# Patient Record
Sex: Female | Born: 1985 | Hispanic: Yes | Marital: Single
Health system: Western US, Academic
[De-identification: ages and names within clinical notes are randomized; demographics above are authoritative.]

---
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
## 2004-01-03 ENCOUNTER — Ambulatory Visit (INDEPENDENT_AMBULATORY_CARE_PROVIDER_SITE_OTHER): Admitting: Gynecology

## 2004-01-07 NOTE — Op Note (Signed)
12/29/03  Dictating Practitioner: Burnett Kanaris, M.D.  Staff Physician:  Date of Operation: 12/29/2003  PREOPERATIVE DIAGNOSES: (1) Intrauterine pregnancy at 25-4/[redacted] weeks  gestation. (2) Fetal anemia with limited middle cerebral artery peak  systolics.  POSTOPERATIVE DIAGNOSES: (1) Intrauterine pregnancy at 25-4/[redacted] weeks  gestation. (2) Fetal anemia with limited middle cerebral artery peak  systolics, status post intrauterine fetal transfusion.  OPERATION: Percutaneous subumbilical blood sampling and fetal  transfusion.  SURGEON/STAFF: ASST: M. Tarsa  ANESTHESIA: Conscious sedation  ESTIMATED BLOOD LOSS: Minimal  COMPLICATIONS: None  INDICATIONS: This patient is a 18 year old gravida 3 para 1 at 25-4/[redacted]  weeks gestation with progressively worsening cerebral artery Dopplers. She  has been consented for intrauterine fetal transfusion and potentially  emergent cesarean section in case of fetal intolerance to the procedure.  PROCEDURE: The patient was taken to the operating room and placed in the  dorsal supine position and was prepped and draped in the normal sterile  fashion. Under ultrasound guidance, insertion of a 20-gauge spinal needle  was performed. The placenta was not traversed. A loop of cord near the  abdominal wall was entered. Pretransfusion hemoglobin was 8 grams with a  hematocrit of 24 percent. The fetus was transfused 47 ml to  post-transfusion hemoglobin of 18 with hematocrit of 48 percent. Fetal  heart tones were observed and noted to be 140s.  Pretransfusion, 0.1 mg of pancuronium was injected for paralyzing the  fetus.  POSTOPERATIVE PLAN: The plan is to watch the patient on Labor and  Delivery until about 10 p.m. If fetal heart status is reassuring, to  discharge home with followup in two days for fetal heart tones and  Dopplers.  Signature Derived From Controlled Access Password  Burnett Kanaris, M.D. 01/03/2004 15:06  DD: 12/29/2003 DT: 12/29/2003 6:36 P DocNo.: [id number]  [id number].[facility]  Referring  Physician:  NONE  Primary Care Physician:  [facility]  [address]  [place], [place] [address]  cc:

## 2004-01-10 ENCOUNTER — Ambulatory Visit (INDEPENDENT_AMBULATORY_CARE_PROVIDER_SITE_OTHER): Admitting: Gynecology

## 2004-01-12 NOTE — Op Note (Addendum)
Dictating Practitioner: Burnett Kanaris, M.D.    Staff Physician:    Date of Operation: 12/29/2003              PREOPERATIVE DIAGNOSES: (1) Intrauterine pregnancy at 25-4/[redacted] weeks  gestation. (2) Fetal anemia with limited middle cerebral artery peak  systolics.  POSTOPERATIVE DIAGNOSES: (1) Intrauterine pregnancy at 25-4/[redacted] weeks  gestation. (2) Fetal anemia with limited middle cerebral artery peak  systolics, status post intrauterine fetal transfusion.  OPERATION: Percutaneous subumbilical blood sampling and fetal  transfusion.  SURGEON/STAFF: ASST: M. Deleah Tison    ANESTHESIA: Conscious sedation  ESTIMATED BLOOD LOSS: Minimal    COMPLICATIONS: None    INDICATIONS: This patient is a 18 year old gravida 3 para 1 at 25-4/[redacted]  weeks gestation with progressively worsening cerebral artery Dopplers. She  has been consented for intrauterine fetal transfusion and potentially  emergent cesarean section in case of fetal intolerance to the procedure.    PROCEDURE: The patient was taken to the operating room and placed in the  dorsal supine position and was prepped and draped in the normal sterile  fashion. Under ultrasound guidance, insertion of a 20-gauge spinal needle  was performed. The placenta was not traversed. A loop of cord near the  abdominal wall was entered. Pretransfusion hemoglobin was 8 grams with a  hematocrit of 24 percent. The fetus was transfused 47 ml to  post-transfusion hemoglobin of 18 with hematocrit of 48 percent. Fetal  heart tones were observed and noted to be 140s.    Pretransfusion, 0.1 mg of pancuronium was injected for paralyzing the  fetus.    POSTOPERATIVE PLAN: The plan is to watch the patient on Labor and  Delivery until about 10 p.m. If fetal heart status is reassuring, to  discharge home with followup in two days for fetal heart tones and  Dopplers.                  Signature Derived From Controlled Access Password  Burnett Kanaris, M.D. 01/03/2004 15:06        DD: 12/29/2003 DT: 12/29/2003 6:36 P  DocNo.: [id number]  [id number]/[id number].[facility]    Referring Physician:  NONE        Primary Care Physician:  [facility]  [address]  [place], [place] [address]    cc:

## 2004-01-17 ENCOUNTER — Other Ambulatory Visit (INDEPENDENT_AMBULATORY_CARE_PROVIDER_SITE_OTHER): Payer: Self-pay | Admitting: Gynecology

## 2004-01-24 ENCOUNTER — Other Ambulatory Visit (INDEPENDENT_AMBULATORY_CARE_PROVIDER_SITE_OTHER): Payer: Self-pay | Admitting: Gynecology

## 2004-01-25 ENCOUNTER — Other Ambulatory Visit: Payer: Self-pay

## 2004-01-26 ENCOUNTER — Inpatient Hospital Stay (HOSPITAL_COMMUNITY)

## 2004-01-26 LAB — ADIF: Plt Est: NORMAL

## 2004-01-26 LAB — CBC WITH DIFF, BLOOD
Basophils: 1 % (ref 0–2)
Eosinophils: 4 % — ABNORMAL HIGH (ref 1–3)
Hct: 29.7 % — ABNORMAL LOW (ref 36.0–46.0)
Hgb: 9.8 g/dL — ABNORMAL LOW (ref 12.0–16.0)
Lymphocytes: 18 % — ABNORMAL LOW (ref 20–40)
MCH: 25.2 pg — ABNORMAL LOW (ref 27–31)
MCHC: 32.9 % (ref 32–37)
MCV: 76.8 um3 — ABNORMAL LOW (ref 82.0–98.0)
MPV: 7.9 fL (ref 7.4–10.4)
Monocytes: 6 % (ref 1–10)
Plt Count: 271 10*3/uL (ref 130–400)
RBC: 3.86 10*6/uL — ABNORMAL LOW (ref 4.00–5.00)
RDW: 13.1 % (ref 10–15)
Segs: 72 % — ABNORMAL HIGH (ref 45–70)
WBC: 9.9 10*3/uL (ref 4.0–11.0)

## 2004-01-28 ENCOUNTER — Encounter: Payer: Self-pay | Admitting: Obstetrics & Gynecology

## 2004-01-31 ENCOUNTER — Ambulatory Visit (INDEPENDENT_AMBULATORY_CARE_PROVIDER_SITE_OTHER): Admitting: Gynecology

## 2004-02-08 ENCOUNTER — Ambulatory Visit (INDEPENDENT_AMBULATORY_CARE_PROVIDER_SITE_OTHER): Admitting: Obstetrics & Gynecology

## 2004-02-10 ENCOUNTER — Other Ambulatory Visit (INDEPENDENT_AMBULATORY_CARE_PROVIDER_SITE_OTHER): Payer: Self-pay | Admitting: Gynecology

## 2004-02-14 ENCOUNTER — Encounter (INDEPENDENT_AMBULATORY_CARE_PROVIDER_SITE_OTHER): Admitting: Gynecology

## 2004-02-18 ENCOUNTER — Inpatient Hospital Stay (HOSPITAL_COMMUNITY)

## 2004-02-18 ENCOUNTER — Other Ambulatory Visit (HOSPITAL_BASED_OUTPATIENT_CLINIC_OR_DEPARTMENT_OTHER): Payer: Self-pay | Admitting: Gynecology

## 2004-02-18 LAB — HEMOGRAM, BLOOD
Hct: 28.9 % — ABNORMAL LOW (ref 36.0–46.0)
Hgb: 9.7 gm/dL — ABNORMAL LOW (ref 12.0–16.0)
MCH: 25.1 pg — ABNORMAL LOW (ref 27–31)
MCHC: 33.7 % (ref 32–37)
MCV: 74.5 um3 — ABNORMAL LOW (ref 82.0–98.0)
MPV: 7.8 fL (ref 7.4–10.4)
Plt Count: 298 10*3/uL (ref 130–400)
RBC: 3.87 10*6/uL — ABNORMAL LOW (ref 4.00–5.00)
RDW: 13.9 % (ref 10–15)
WBC: 3.9 10*3/uL — ABNORMAL LOW (ref 4.0–11.0)

## 2004-02-19 ENCOUNTER — Other Ambulatory Visit (HOSPITAL_BASED_OUTPATIENT_CLINIC_OR_DEPARTMENT_OTHER): Payer: Self-pay | Admitting: Gynecology

## 2004-02-19 LAB — BILIRUBIN, TOTAL BLOOD: Bilirubin, Tot: 2.7 mg/dL — ABNORMAL HIGH (ref <1.2)

## 2004-02-19 LAB — RAPID PLASMA REAGIN, BLOOD: RPR: NONREACTIVE

## 2004-02-20 LAB — HEMOGRAM, BLOOD
Hct: 26.8 % — ABNORMAL LOW (ref 36.0–46.0)
Hgb: 9 g/dL — ABNORMAL LOW (ref 12.0–16.0)
MCH: 25.2 pg — ABNORMAL LOW (ref 27–31)
MCHC: 33.6 % (ref 32–37)
MCV: 75.1 um3 — ABNORMAL LOW (ref 82.0–98.0)
MPV: 7.9 fL (ref 7.4–10.4)
Plt Count: 283 10*3/uL (ref 130–400)
RBC: 3.56 10*6/uL — ABNORMAL LOW (ref 4.00–5.00)
RDW: 13.9 % (ref 10–15)
WBC: 2.7 10*3/uL — ABNORMAL LOW (ref 4.0–11.0)

## 2004-02-22 LAB — HIV 1/2 ANTIBODY & P24 ANTIGEN ASSAY, BLOOD: HIV 1/2 Antibody & P24 Antigen Assay: NONREACTIVE

## 2004-02-22 LAB — URINE CULTURE

## 2004-02-25 LAB — BLOOD CULTURE

## 2004-02-25 NOTE — Op Note (Signed)
Dictating Practitioner: Jairo Ben. Gardner Candle, M.D.    Staff Physician: Burnett Kanaris, M.D.    Date of Operation: 02/19/2004          PREOPERATIVE DIAGNOSES: (1) Intrauterine pregnancy at 33 weeks. (2) Rh  isoimmunization with fetal anemia. (3) Active phase arrest.  POSTOPERATIVE DIAGNOSES: (1) Intrauterine pregnancy at 33 weeks. (2) Rh  isoimmunization with fetal anemia. (3) Active phase arrest.  PROCEDURE: Primary low transverse cesarean section via Pfannenstiel.  SURGEON/STAFF: Bonnetta Barry ASST: GCaryl Asp. Barcarse    INTRAVENOUS FLUIDS: 1500 ml.  URINE OUTPUT: 200 ml of clear urine at the end of the procedure.  ESTIMATED BLOOD LOSS: 1000 ml.  ANESTHESIA: Spinal.    FINDINGS: Female infant in the vertex position with Apgars of 8 and 9 at 1  and 5 minutes, respectively, normal uterus, tubes and ovaries, weight of  2305 g.    COMPLICATIONS: None.    PROCEDURE: The patient was taken to the operating room where her spinal  anesthesia was found to be adequate. She was then prepared and draped in  the usual sterile fashion in the dorsal supine position with a leftward  tilt. She was tested with Allis clamps and noted to be pain free.    A Pfannenstiel skin incision was made with a scalpel and carried to the  underlying layer of fascia with the Bovie. The fascia was incised in the  midline and this incision was extended laterally with a Mayo scissors. The  inferior aspect of the fascial incision was grasped with Kocher clamps,  elevated and the underlying rectus muscles dissected off bluntly and with  the Mayo scissors.    In a similar fashion, the superior aspect of the fascial incision was  grasped with Kocher clamps, elevated and the underlying rectus muscles  dissected off bluntly. The rectus muscles were separated in the midline.  The peritoneum was identified and entered bluntly with a digit. The  peritoneal incision was extended superiorly and inferiorly with good  visualization of the bladder. The  bladder blade was then inserted and the  uterine position was assessed. The vesicouterine peritoneum was  identified, grasped with pickups and entered sharply with the Metzenbaum  scissors. This incision was extended laterally and the bladder flap was  created digitally. The lower uterine segment was incised carefully with a  scalpel and extended laterally with digital stretching.    The infant's head and body were delivered atraumatically. The infant was  bulb suctioned. The cord was clamped and cut and the infant was handed off  to the waiting pediatricians. Cord gases were sent. Arterial blood gas:  pH 7.31, PCO2 50, PO2 26, base excess -2, bicarbonate 25. Venous gas: pH  7.30, PCO2 54, PO2 22, base excess -1 and bicarbonate 26. The placenta was  then removed with gentle expression and the uterus was exteriorized and  cleared of all clots and debris.    A 1-0 chromic was used in a running locked fashion to obtain excellent  hemostasis. There was an area of bleeding noted in the left corner felt to  be a branch of the uterine artery which was ligated in a figure-of-eight  fashion to obtain excellent hemostasis. A second layer of the same suture  was then used in an imbricating fashion and good hemostasis was noted. The  bladder flap was then closed with 4-0 chromic. There was a rent noted  slightly lower than the bladder flap but not in the bladder as the Foley  bulb  was brought up and could not be brought to that rent which was  repaired with 4-0 chromic as well. The uterus was returned to the abdomen.  The gutters were cleared of all clots and debris and the uterine incision  was carefully reinspected and noted to have excellent hemostasis. The  fascial planes and muscle layers were also inspected and noted to be  hemostatic. The fascia was reapproximated with an 0 Dexon in a running  fashion. The subcutaneous area was irrigated with warm normal saline and  the skin was closed with staples.    The patient  tolerated the procedure well. Sponge, lap and needle counts  were correct times two. Ancef 1 g was given at the time of cord clamp.  The patient was taken to the recovery room in stable condition. The  attending physician, Dr. Herbie Saxon, was present and scrubbed for the entire  procedure.                      Signature Derived From Controlled Access Password  Burnett Kanaris, M.D. 02/25/2004 09:08    DD: 02/21/2004 DT: 02/21/2004 4:33 P DocNo.: [id number]  GCM/[id number].[facility]    Referring Physician:  SELF        Primary Care Physician:  UNKNOWN        cc:

## 2004-02-25 NOTE — Lab (Signed)
FINAL PATHOLOGIC DIAGNOSIS:  A: Uterus, placenta, cesarean section   -Immature placenta.   -Necrotic umbilical artery.   -Mural thrombosis of fetal surface vessels.  COMMENT: In the umbilical cord, there is one necrotic artery with necrosis  extending into the Wharton's jelly. Anucleated red-blood cells are present  in the vessels of the umbilical cord (consistent with history of fetal  transfusions). Hemosiderin-laden macrophages are present in the membranes.  There is no inflammation identified. This case was reviewed by Dr. Levin Bacon.  SPECIMEN(S) SUBMITTED: A: Placenta  CLINICAL HISTORY: 18 year old mother, G3P3 status post first LTCS for  APA/failed induction @33 .3 weeks for Rh isoimmunization, status post PUBS x  4 with increased MCA dopplers 1.5 MOM. Fetal transfusions x 4 (lost one  umbilical artery) Rh incompatibility. Prematurity. Baby's medical record  number [phone] (Boy), birth weight 2305 grams, apgars 8/9.  GROSS DESCRIPTION: A: Received without fixative labeled with the  patient's name, medical record number and "placenta" is a discoid placenta  measuring 19 x 17 x 1.5 cm. The umbilical cord measures 31 cm in length  and 1 cm in diameter with a slight left twist. The umbilical cord is  eccentrically inserted with three vessels on cross section. There is green  discoloration and probable thrombus but there is no knot. The membranes  are complete and ruptured at the placental margin. The fetal surface is  purple and translucent. The maternal surface is intact. There is no  calcification and no retroplacental hematoma. The trimmed weight is 330  grams. Serial sectioning of the placenta reveals red-brown spongy villous  tissue. There are no infarcts and no intervillous thrombi. Representative  sections are submitted in cassettes A1 through A4.  [facility]  CONFIDENTIAL HEALTH INFORMATION: Health Care information is personal and  sensitive information. If it is being faxed to you it is done so  under  appropriate authorization from the patient or under circumstances that do  not require patient authorization. You, the recipient, are obligated to  maintain it in a safe, secure and confidential manner. Re-disclosure  without additional patient consent or as permitted by law is prohibited.  Unauthorized re-disclosure or failure to maintain confidentiality could  subject you to penalties described in federal and state law. If you have  received this report or facsimile in error, please notify the [facility] immediately and destroy the received document(s).   Material reviewed and Interpreted and  Report Electronically Signed by:  Kingsley Spittle M.D. [phone])  Attending Surgical Pathologist  02/25/04 16:49  Electronic Signature derived from a single  controlled access password

## 2004-02-29 ENCOUNTER — Emergency Department: Payer: Self-pay

## 2004-02-29 ENCOUNTER — Other Ambulatory Visit: Payer: Self-pay | Admitting: Emergency Medicine

## 2004-03-01 ENCOUNTER — Emergency Department (HOSPITAL_BASED_OUTPATIENT_CLINIC_OR_DEPARTMENT_OTHER): Admitting: Emergency Medicine

## 2004-03-01 ENCOUNTER — Other Ambulatory Visit: Payer: Self-pay | Admitting: Pulmonary Disease

## 2004-03-01 LAB — CHLAMYDIA/GC PCR-GENITAL: Chlamydia/GC PCR-Genital: NEGATIVE

## 2004-03-02 ENCOUNTER — Ambulatory Visit (INDEPENDENT_AMBULATORY_CARE_PROVIDER_SITE_OTHER): Admitting: Maternal & Fetal Medicine

## 2004-03-02 NOTE — ED Notes (Addendum)
============================== ADMIT SUMMARY ==============================    RECEIVING NURSE -      ED NURSE -     +------------------------------- ALLERGIES -------------------------------+   none    +-------------------------- ADMITTING DIAGNOSIS --------------------------+   UTI   recent vaginal delivery complicated by endometritis    +--------------------------- ADMITTING SERVICE ---------------------------+    +------------------------ MOST RECENT VITAL SIGNS ------------------------+  BP - 113/65 PULSE - 63   RESPIRATIONS - 18 O2 SAT - 99   TEMPERATURE - 99.3 MODE - Oral   GCS TOTAL -   PAIN - 0 PAIN QUALITY - N/A   PAIN LOCATION - none     +-------------------------------- FLUIDS ---------------------------------+  DATE TIME IV FLUID L/R LOCATION SIZE HUNG ABSORBED  ---------------------------------------------------------------------------     TOTAL IV: 0 ml    TOTAL OUTPUT: 0 ml TOTAL PO: 0 ml    +------------------------------ MEDICATIONS ------------------------------+  DATE TIME MEDICATION VERIFYING RN RN INIT  ---------------------------------------------------------------------------    09/14 0340 SULFAMETHOXAZOLE /TRIMETHOPRIM 1 KEM   Tabs PO    +------------------------------- LABS DONE -------------------------------+  ACT- MD MD RN AP +INITIALS+  IVE DATE TIME TIME TIME TREATMENT ORDERS MD RN AP   ---------------------------------------------------------------------------     09/14 0246 0305 0308 CHLAMYDIA/GC PCR-URINE NXL KEM lau   Specimen Type: Urine   09/14 0246 0305 0308 CULTURE,GC, CHLAMYDIA/GC NXL KEM lau   PCR-GENITAL   Specimen Type: Genital    EKG DONE - NO    +---------------------------- PROCEDURE NOTES ----------------------------+    +------------------------ OTHER NURSING PROCEDURES -----------------------+     +--------------------------- PSYCHOSOCIAL NEEDS --------------------------+  +------------------------- BARRIERS TO LEARNING --------------------------+    ASSESSMENT-  Assessment Done with Findings of:      BARRIERS-    No Barriers  SUPPORT PERSON-      SPECIAL CONSIDERATIONS-    assessment done at 0215  ============================== TRIAGE RECORD ==============================    CHIEF COMPLAINT- Vaginal Bleed    TIME OF ONSET- N/A : +-STANDING-+ +--SEATED--+  TRIAGE CATEGORY- 2 : BP PULSE BP PULSE   ROOM- 10 : N/A/N/A N/A 113/65 63   MODE OF ARRIVAL- Car :   IN CUSTODY- No : TEMP MODE O2SAT RESP LMP   PRIVATE MD- quidos : 99.3 Oral 99 18 N/A       :   WORK RELATED INJURY- No : +--GCS--+ +--PUPILS--+  RETURN IN 72 HOURS- No : E V M TOT L R RESPONSE  TRIAGE NURSE- Efren Fuerte : X X X N/A X X N/A     IS THIS VISIT RELATED TO ASSAULT OR DOMESTIC VIOLENCE- no   PAIN TYPE- V NOW- 0 TOLERABLE AT- 0 QUALITY- N/A      PAIN LOCATION- none RADIATES TO- none   LATEX ALLERGY FORM- No LATEX ALLERGY- No TETANUS- N/A   IMMUNIZATION- N/A PED HEIGHT- N/A WEIGHT- N/A KG  ADDITIONAL FORMS- No   +------------------------- CARE PRIOR TO ARRIVAL -------------------------+   no  +------------------------------- ALLERGIES -------------------------------+   none  +------------------------------ MEDICATIONS ------------------------------+   vicoden, motrin  +-------------------------- PAST MEDICAL HISTORY -------------------------+   g3,p2, ab1  +---------------------------- CURRENT HISTORY ----------------------------+   states bleeding and passing clots when using restroom, used 3pads   yesterday,    =========================== REASSESSMENT VITALS ===========================   R T M I   E E O N   S M D O2 PUPILS +---GCS----+ I  DATE TIME BP HR P P E SAT L R E V M TOT POSITION T  ---------------------------------------------------------------------------    09/14 0001 113/65 63 18 99.3 O 99  Seated EAF  09/14 0001 / Standing EAF     +-----------------PAIN-----------------+   T       Y N T       P O O      DATE TIME E W L LOCATION QUALITY RADIATES COMMENT       ---------------------------------------------------------------------------    09/14 0001 V 0 0 none N/A none Triage  09/14 0001 Triage    ============================= NURSE DISCHARGE =============================    DISCHARGE NURSE- Arsenio Loader   DISPOSITION- Seen & Treated WITH- By Self   ACCOMPANIED BY- N/A   Jeryl Columbia Brantley Fling-    N/A   TIME OF DISPOSITION- 03/01/2004 0330 LEFT ED VIA- Ambulate   TEANSFERRED TO- N/A REASON- N/A   ADMITTED TO- N/A ROOM- N/A   NURSE REPORT TO- N/A REPORT TIME- N/A   BELONGINGS- With Patient ENVELOPE NUMBER- N/A   CONDITION ON DISCHARGE- Stable   AFTERCARE PROVIDED WITH- Written and Verbal   WHAT AFTERCARE INSTRUCTIONS WERE GIVEN AND REVIEWED WITH PATIENT  AND/OR FAMILY?- (see EPIC instructions)   epic   IN WHAT LANGUAGE WERE THESE GIVEN?- English OTHER: N/A   TRANSLATED BY- N/A OTHER: N/A   GCS- E: 4 V: 5 M: 6 TOTAL: 15   PAIN LEVEL UPON DISPOSITION- 0 OUT OF 10  EXPECTED OUTCOMES MET- Yes   WHAT MEDS WERE PROVIDED FROM DISCHARGE PYXIS?-    N/A   RX TO BE FILLED FOR- N/A   DID THE PATIENT OR RESPONSIBLE CARE PROVIDER UNDERSTAND THE FOLLOW UP  RECOMMENDATION?- No DISPOSITION BY- RN        ============================== POINT OF CARE ==============================    OCCULT BLOOD STOOL RESULTS   Norm results neg.  DATE TIME RESULTS DONE BY CONTROL POSTIVE CONTROL NEGATIVE     URINE PREGNANCY TEST   Norm results for non pregnant females neg.  DATE TIME RESULTS DONE BY     URINE DIP   Norm results - All neg. with pH 4.6 to 8.0 and urobili 0.1 to 1.0   LEUKO NI- PRO- GLU- URO-   DATE TIME CYTE TRITE PH TEIN COSE KETONES BILI BILI BLOOD BY     FINGER STICK GLUCOSE   Norm results 60 to 110 mg/dl  DATE TIME RESULTS DONE BY     FINGER STICK HEMOGLOBIN   Norm results adult female 27 to 17 gm/dl  Norm results adult female 30 to 16 gm/dl  DATE TIME RESULTS DONE BY   09/14 0142 11.3 FGG     ============================== MD NOTES H&P ===============================    TIME OF NOTE WRITTEN-  N/A      CHIEF COMPLAINT- N/A        HISTORY OF PRESENT ILLNESS  09/14 0427 Heywood Iles, MD     18 y.o. female G3P2A1 s/p recent C section 9/3, complicated   by per history endometritis, necessitating IV antibiotics.    Pt still had subjective fevers after d/c. Pt. continued to   have vaginal bleeding after d/c. Pt. states that bleeding   stopped 2 days ago, but bleeding restarted today, with   passage of blood clots. Used 3 pads today. Persistent   subjective fevers, now with dysuria. No flank pain.      PAST MEDICAL/SURGICAL HISTORY  09/14 0427 Heywood Iles, MD     s/p C section 9/3 complicated by endometritis   G3P2A1      FAMILY HISTORY- NC   SOCIAL HISTORY- no tob, no drugs, occ. ETOH   OTHER- NC  REVIEW OF SYSTEMS- All other systems reviewed and are negative.     PHYSICAL EXAM  09/14 0427 Ni-cheng Elaina Pattee, MD     Gen: thin young female, NAD   HEENT: PERRL, OP: PMMM   Neck: supple, no JVD   CV: S1, S2, no m/r/g   Lungs: CTAB   Back: no flank pain   Abd: tender to palpation at L and R inguinal, and suprapubic   areas, no rebound, no guarding , + BS   Pelvic exam: speculum- visible blood clots passing through   cervical os, no discharge on vaginal mucosa, bimanual exam:   mild L adnexal tenderness, no tenderness over fundus, no R   adnexal tenderness, no CMT   Extrem: no edema      IMPRESSION  09/14 0427 Heywood Iles, MD     18 y.o. G53P2A1 female s/p 9/3 C section delivery complicated   by endometritis, treated with IV abx, c/o recurrent vaginal   bleeding, dysuria concerning for UTI, retained products of   conception, PID, given findings on PE less likely recurrent   endometritis.      MEDICAL DECISION MAKING  09/14 0427 Ni-cheng Elaina Pattee, MD     -u/a- s/f UTI- first dose Bactrim here, rx given    -GC PCR, culture, urine   -d/c home with OB/GYN f/u

## 2004-03-02 NOTE — ED Notes (Addendum)
==============================   ATTENDING NOTE =============================    09/14 0315 [name], MD Attending     Pt seen and discussed iwth DR. Elaina Pattee.      18 yo femlae here for abdominal pain. States has been there   since yesterday. [place] passing blood vaginally. denies   fevers or chills. no pain with urination. no frequency. has   hx of endometritis, seen by gyn. tkaing motrin and vicodin.   no nausea or vomiting. no diarrhea.      PE:   Patient alert, awake, appropriate. Moves about the bed   without diffiuclty.   Head: normocephalic, atraumatic. Perrl, eomi. No scleral   icterus or conjunctival injection. Oropharynx without   erythema or exudates.   Neck: supple, no lymphadenopathy.   Chest: clear to auscultation bilaterally. No wheezes,   rhonchi, rales.   Heart: s1s2 regular rate.   Abdomen: positive active bowel sounds in all 4 quadrants.   Soft, mildly tender in both lower quadrants and   suprapubically. nondistended. No rebound or guarding.   pelvic per residnet note.   Extremities: no cyanosis, clubbing, edema.   Neuro: CN 2-12 intact. Strength 5/5 globally. Sensation   intact. No ataxia with gait.   Skin: warm, dry, no rashes.      although pt may have retained products, is not fevrile or   passing sig blood. can f/u wioth gyn as outpt. mild   tenderness, but not consistet with ongoing/increasing   endometritis. no evidence of pid. ua iwth evidence of   infection. will rx abx, first dose here. to return if sx   worsen. otherwise f/u with ob this week.   Agreew ith resident assessemtn and plan.

## 2004-03-02 NOTE — ED Notes (Addendum)
CASE PRESENTED TO- Kelsey Weber     ============================= PHYSICIAN NOTES =============================      ============================= PROCEDURE NOTES =============================      ================================ LAB NOTES ================================    09/14 0430 Kelsey Elaina Pattee, MD     POC Hgb wnl, u/a s/f UTI.      ================================= IMAGES ==================================      ============================== MD DISCHARGE ===============================    DISCHARGE PHYSICIAN- Kelsey Weber   CHIEF COMPLAINT- N/A CASE PRESENTED TO- Kelsey Weber   CONDITION OF DISCHARGE- Stable   WAS THIS VISIT FOR A WORK RELATED ILLNESS OR INJURY- No      PRIMARY CARE PHYSICIAN- Kelsey Weber   HAS PCP BEEN CONTACTED- No H&P NOTE WAS DICTATED- No   DISCHARGE DIAGNOSIS-    UTI recent vaginal delivery complicated by endometritis     DISCHARGE INSTRUCTIONS    09/14 0339 Kelsey Iles, MD     PHYSICIAN- [name], MD Attending FOLLOW-UP (DAYS)- OB/GYN    APPOINTMENT- No RETURN TO- N/A LANGUAGE- English    INSTRUCTIONS-   ABNORMAL VAGINAL BLEEDING    URINARY TRACT INFECTION    MEDICATIONS-   SULFA DRUGS    REFERRAL CLINICS-   OB/GYN [place]    REFERRAL PHYSICIANS-   ADDITIONAL INSTRUCTIONS-   Follow- up with OB/GYN to make sure you do not have any   retained   products of conception. Return to emergency room if your   abdominal   pain worsens, if bleeding worsens, if you start having   abnormal   vaginal discharge, or if fevers and chills persist.      ============================= FOLLOW UP NOTES =============================    09/15 03/02/2004 2233 Valla Leaver, MD     Reason for Addendum or Follow Up: Microbiology Culture   Result Follow Up   Action Taken: None required   pt was given rx for cipro on 03/01/04, urine cx grew e. coli   > 100,000 cfu => sens to TMP/SMX

## 2004-03-02 NOTE — ED Notes (Addendum)
=================================   ORDERS ==================================    ACT- MD MD RN AP +INITIALS+  IVE DATE TIME TIME TIME TREATMENT ORDERS MD RN AP   ---------------------------------------------------------------------------     09/14 0130 0311 0131 POC UPT LGL KEM MQR   09/14 0130 0311 0130 POC FSHG LGL KEM MQR   09/14 0246 0305 0308 CHLAMYDIA/GC PCR-URINE NXL KEM lau   Specimen Type: Urine   09/14 0246 0305 0308 CULTURE,GC, CHLAMYDIA/GC NXL KEM lau   PCR-GENITAL   Specimen Type: Genital   09/14 0304 0305 0309 Discharge NXL KEM lau   09/14 0310 0314 0315 SULFAMETHOXAZOLE /TRIMETHOPRIM 1 NXL KEM lau   Tabs PO

## 2004-03-03 ENCOUNTER — Emergency Department (HOSPITAL_BASED_OUTPATIENT_CLINIC_OR_DEPARTMENT_OTHER): Admitting: Emergency Medicine

## 2004-03-03 LAB — GONOCOCCUS CULTURE

## 2004-03-09 NOTE — Op Note (Addendum)
Dictating Practitioner: Jori Moll. Arlean Hopping, M.D.    Staff Physician: Jori Moll. Arlean Hopping, M.D.    Date of Operation: 12/01/2003          PREOPERATIVE DIAGNOSES: (1) Intrauterine pregnancy at 22 weeks 4 days.  (2) Rh isoimmunization. (3) Fetal hydrops.  POSTOPERATIVE DIAGNOSES: (1) Intrauterine pregnancy at 22 weeks 4 days.  (2) Rh isoimmunization. (3) Fetal hydrops.  OPERATION: Periumbilical blood sampling and intrauterine blood  transfusion.  SURGEON/STAFF: A. Hull/D. Schrimmer/V. Antoino Westhoff    ANESTHESIA: IV sedation.  COMPLICATIONS: None.  ESTIMATED BLOOD LOSS: Less than 20 mL.    INDICATIONS: This is a 18 year old, gravida 3, para 1, today at 22 weeks 4  days of gestation, with Rh isoimmunization. Her ultrasound examinations  had revealed an increased systolic velocity in the middle cerebral artery  suggesting fetal anemia. Also, mild ascites and pericardial effusions have  been noted. The risks versus the benefits of the procedure were discussed  with the patient and informed consent was obtained.    FINDINGS: Single, viable, live fetus. On today's evaluation, no signs of  ascites or pericardial effusions were noted. Middle cerebral artery  Dopplers were performed prior to the procedure, with a peak systolic  velocity that was less than 1.29 MoM.    PROCEDURE: After obtaining signed informed consent, the patient was taken  to the operating room. She was prepped as usual. Conscious sedation was  given. Guided with ultrasound, four attempts were performed with a  20-gauge needle through the fetal abdominal cord insertion. Blood 0.5 mL  was obtained and sent for hematocrit, and 0.1 mL of pancuronium were  administered to the fetus. Blood 16 mL with a hematocrit of 71% was then  transfused. Postoperative hematocrit was then obtained. The  pretransfusion hemoglobin was 14.7 and the postoperative hemoglobin was  17.4. MCA Dopplers following the procedure showed a peak systolic velocity  of 35. Fetal heart rate was documented  at 135 beats per minute following  the procedure.    The patient tolerated the procedure well and was taken to the recovery room  in stable condition.                      Manual Signature  Manual Signature 01/21/2004 11:47    DD: 12/01/2003 DT: 12/02/2003 7:10 A DocNo.: [id number]  [id number].[facility]    Referring Physician:  Loreli Slot        Primary Care Physician:  [facility]  [address]  [place], [place] [address]    cc:

## 2004-03-15 ENCOUNTER — Inpatient Hospital Stay (HOSPITAL_BASED_OUTPATIENT_CLINIC_OR_DEPARTMENT_OTHER): Payer: Self-pay | Admitting: Gynecology

## 2004-03-22 NOTE — Discharge Summary (Signed)
 Dictating Practitioner: Lucille Saas. Eilene Grater, M.D.    Attending Physician: Germaine Kohut, M.D.    Date of Admission: 02/18/2004  Date of Discharge: 02/23/2004          DISCHARGE DIAGNOSES: (1) Intrauterine pregnancy at 33 weeks. (2) Rh  isoimmunization.    PROCEDURE: Primary cesarean section.    PRESENT ILLNESS: This is a 18 year old, gravida 3, para 1, at 33-3/7 weeks  sent in for induction secondary to elevated umbilical Dopplers status post  intrauterine transfusion x4. The patient reports mild uterine  contractions. Positive fetal movement. Denies any loss of fluid, vaginal  bleeding, preeclampsia symptoms or dysuria.    ISSUES: (1) Dating. LMP consistent with 21-week ultrasound. (2) Rh  isoimmunization. The patient is O negative with antibody screen positive.  Status post PUBS x4 on 01/26/04. MCA Doppler on 02/17/04 revealed 1.5 MoM  possibly concerning for worsening fetal anemia. (3) Prenatal labs.  Missing PPD, GBS, one-hour glucose tolerance test, HIV. (4) Betamethasone  given on the 12th and 13th and had a rescue dose on September 1.    DATING CRITERIAL: LMP 06/29/03, EDC 04/05/04. Serial ultrasound  examinations revealed size equal to dates with the same final EDC. As  mentioned above, on September 1, estimated gestational age by sonogram was  33-and-2, by dates was 33-and-3, revealing a posterior placenta, EFW 2344.  MCA Doppler worsening, 1.5 MoM, likely worsening fetal anemia.    PAST MEDICAL HISTORY: None.    PAST SURGICAL HISTORY: None.    ALLERGIES: NO KNOWN DRUG ALLERGIES.    TRANSFUSIONS: No maternal transfusions. The fetus did receive four  transfusions this pregnancy.    MEDICATIONS: Phenobarbital, three tablets daily.    HISTORY OF SEXUALLY TRANSMITTED DISEASES: None.    PAST OBGYN HISTORY: In 2003, term vaginal delivery at 6 pounds. In 2004,  TAB. No issues.    SOCIAL HISTORY: The patient is single. Father of baby is involved.  Tobacco, alcohol and drugs denied. FAMILY HISTORY:  Noncontributory.    PHYSICAL EXAMINATION: Vital signs: Height 5\' 1" , weight 192 pounds.  Temperature 96.1, pulse 84, blood pressure 110/49, respiratory rate 16.  General: No acute distress. HEENT: Within normal limits. Back: No  costovertebral angle tenderness. Lungs: Clear to auscultation  bilaterally. Heart: Regular rate and rhythm. Abdomen: Soft, gravid,  nontender. Integument: No rashes. Neck and thyroid: Supple. Breasts:  Soft and nontender. Neurologic: Grossly intact. Extremities: 1+ lower  edema. No clonus. Sterile vaginal exam: 1, long, and high. Limited  ultrasound was vertex. Clinical estimated fetal weight 2344 gm. Fetal  heart rate was reactive.    PRENATAL LABORATORY STUDIES: O negative, antibody screen positive. H and  H 35 and 11, platelets 294. Pap smear within normal limits. Rubella  equivocal. VDRL nonreactive. Hepatitis B surface antigen negative. PPD  not done. Chlamydia and GC culture negative. Triple screen negative.    ASSESSMENT AND PLAN: This is a 18 year old, gravida 3, para 1, at 33-and-3  weeks with Rh isoimmunization, status post PUBS and fetal transfusions,  with worsening fetal anemia, planned for induction. (1) Admit and induce  with Cervidil and Pitocin. Anticipate normal spontaneous vaginal delivery.  (2) Prenatal laboratory studies. Place PPD. Needs Rubavax postpartum.  (3) Betamethasone. The patient received rescue dose on 02/17/04. (4) Rh  isoimmunization. Induction, as stated above, per Dr. Lennard Quirk. (5)  Fetal status was reassuring.    HOSPITAL COURSE BY ISSUE:  1. Intrapartum. When the patient was admitted, it was felt that the baby  was vertex/oblique. The patient was  offered a cesarean section but  strongly desired to try for a vaginal delivery. The plan was to keep the  patient n.p.o. The patient did receive Cervidil x1. She progressed to 2  to 3, 50, and -3, at which time Pitocin was started. The patient had an  IUPC placed which revealed adequate contractions. The patient  had  essentially no cervical change after greater than eight hours despite  adequate contractions, at which point it was recommended that the patient  have a cesarean section, and the patient agreed. Please see operative  report for complete details.    The patient was transferred to the [facility], where she met all of her  milestones on time. Her Foley catheter was discontinued on postoperative  day #1, and she maintained excellent urine output. The patient's  preoperative H and H were 9.7 and 28.9. Postoperatively, they were 9 and  26.8, which is an appropriate drop. The patient ambulated on postoperative  day #1, and her diet was serially advanced from clears to a regular diet,  which she tolerated well.    2. Postpartum endometritis. On postoperative day #1, the patient was  noted to have temperature to 101.7 with moderate abdominal pain. The  decision was that this was most likely postpartum endometritis, and the  patient was started on gentamicin and clindamycin. Her fever curve  decreased appropriately, and the antibiotics were discontinued once she was  afebrile for 48 hours.    3. Acute-blood-loss anemia. As mentioned, the patient was anemic on  admission, but her hematocrit did decrease to 23.8, and she was started on  Colace and iron. She remained asymptomatic at the time of discharge.    The patient was discharged home on postoperative day #4 after demonstrating  that she could ambulate without assistance, void spontaneously, tolerate a  regular diet, that her pain was well controlled with p.o. analgesia, and  that she had been afebrile for greater than 48 hours and was asymptomatic  from her anemia. The patient was discharged home with routine precautions  including to return for fever, increasing abdominal pain, heavy vaginal  bleeding, redness or drainage around the incision site, abstain from sexual  activity for four to six weeks, and do not lift anything heavier than the  baby. Discharge medications  were Colace, ibuprofen, iron, and Vicodin.  She was to follow up with Dr. Boykin Cable in six weeks.                      Reviewed & Electronically Signed  Eliette Drumwright C. Eilene Grater, M.D. 03/19/2004 15:41    Signature Derived From Controlled Access Password  Germaine Kohut, M.D. 03/22/2004 12:27    DD: 03/15/2004 DT: 03/15/2004 6:21 P DocNo.: [id number]  [id number].[facility]      Referring Physician:  [place]/OB        Primary Care Physician:  [facility]  DR.[email or web address]      cc:

## 2004-03-23 ENCOUNTER — Ambulatory Visit (INDEPENDENT_AMBULATORY_CARE_PROVIDER_SITE_OTHER): Admitting: Maternal & Fetal Medicine

## 2004-03-23 LAB — STAT URINALYSIS, THORNTON
Bilirubin: NEGATIVE
Glucose: NEGATIVE
Ketones: NEGATIVE
Leuk Esterase: NEGATIVE
Nitrite: NEGATIVE
Protein: NEGATIVE
Specific Gravity: 1.025 (ref 1.002–1.030)
Urobilinogen: 0.2 (ref 0.2–?)
pH: 6 (ref 5.0–8.0)

## 2004-03-24 LAB — CHLAMYDIA/GONORRHEA PCR, GENITAL: Chlamydia/GC PCR-Genital: NEGATIVE

## 2004-03-27 NOTE — Lab (Signed)
 SPECIMENS SUBMITTED:  Cervical / Vaginal (Pap) Smear     CLINICAL INFORMATION:   [facility]  FINAL CYTOLOGIC INTERPRETATION:  No Atypical or Malignant Cells     SPECIMEN ADEQUACY:  Satisfactory for evaluation  The Pap smear is a screening test with an inherent low error rate. Although  a normal [negative] result is highly predictive of the absence of cervical  cancer and its precursor lesions, it does not exclude the presence of  significant disease. Results must be evaluated within the context of the  individual patient.  CONFIDENTIAL HEALTH INFORMATION: Health Care information is personal and  sensitive information. If it is being faxed to you it is done so under  appropriate authorization from the patient or under circumstances that do  not require patient authorization. You, the recipient, are obligated to  maintain it in a safe, secure and confidential manner. Re-disclosure  without additional patient consent or as permitted by law is prohibited.  Unauthorized re-disclosure or failure to maintain confidentiality could  subject you to penalties described in federal and state law. If you have  received this report or facsimile in error, please notify the [facility] Department immediately and destroy the received document(s).   Material reviewed and Interpreted and  Report Electronically Signed by:  Arie Begin O'Harra CT(ASCP)  Sr. Cytotechnologist  03/27/04 16:07  Electronic Signature derived from a single  controlled access password

## 2004-03-28 LAB — URINE CULTURE

## 2004-04-03 ENCOUNTER — Ambulatory Visit (INDEPENDENT_AMBULATORY_CARE_PROVIDER_SITE_OTHER): Admitting: Gynecology

## 2004-04-07 NOTE — Lab (Signed)
 SPECIMENS SUBMITTED:  Cervical (Pap) Smear     CLINICAL INFORMATION:  LMP: 03/02/2004 [facility]  FINAL CYTOLOGIC INTERPRETATION:  No Atypical or Malignant Cells     SPECIMEN ADEQUACY:  Satisfactory for evaluation  The Pap smear is a screening test with an inherent low error rate. Although  a normal [negative] result is highly predictive of the absence of cervical  cancer and its precursor lesions, it does not exclude the presence of  significant disease. Results must be evaluated within the context of the  individual patient.  CONFIDENTIAL HEALTH INFORMATION: Health Care information is personal and  sensitive information. If it is being faxed to you it is done so under  appropriate authorization from the patient or under circumstances that do  not require patient authorization. You, the recipient, are obligated to  maintain it in a safe, secure and confidential manner. Re-disclosure  without additional patient consent or as permitted by law is prohibited.  Unauthorized re-disclosure or failure to maintain confidentiality could  subject you to penalties described in federal and state law. If you have  received this report or facsimile in error, please notify the [facility] Department immediately and destroy the received document(s).   Material reviewed and Interpreted and  Report Electronically Signed by:  Mearl Spice SCT(ASCP)  Cytotechnologist, Iven Mark  04/07/04 08:12  Electronic Signature derived from a single  controlled access password

## 2005-01-09 ENCOUNTER — Other Ambulatory Visit (INDEPENDENT_AMBULATORY_CARE_PROVIDER_SITE_OTHER): Payer: Self-pay | Admitting: Gynecology

## 2005-01-12 ENCOUNTER — Ambulatory Visit

## 2016-06-28 ENCOUNTER — Encounter (INDEPENDENT_AMBULATORY_CARE_PROVIDER_SITE_OTHER): Payer: Self-pay | Admitting: Obstetrics & Gynecology

## 2016-06-28 DIAGNOSIS — Z3689 Encounter for other specified antenatal screening: Principal | ICD-10-CM

## 2016-06-29 ENCOUNTER — Ambulatory Visit: Payer: Medicaid Other | Attending: Obstetrics & Gynecology

## 2016-06-29 DIAGNOSIS — O09892 Supervision of other high risk pregnancies, second trimester: Secondary | ICD-10-CM | POA: Insufficient documentation

## 2016-06-29 DIAGNOSIS — Z3A14 14 weeks gestation of pregnancy: Secondary | ICD-10-CM | POA: Insufficient documentation

## 2016-06-29 DIAGNOSIS — Z3689 Encounter for other specified antenatal screening: Principal | ICD-10-CM | POA: Insufficient documentation

## 2016-06-29 DIAGNOSIS — O34219 Maternal care for unspecified type scar from previous cesarean delivery: Secondary | ICD-10-CM | POA: Insufficient documentation

## 2016-06-29 DIAGNOSIS — O321XX0 Maternal care for breech presentation, not applicable or unspecified: Secondary | ICD-10-CM | POA: Insufficient documentation

## 2016-06-29 NOTE — Procedures (Signed)
PATIENT HAD AN ULTRASOUND AND AN AVS WAS PROVIDED.

## 2016-06-29 NOTE — Patient Instructions (Signed)
Todays ultrasound findings were: normal placenta. Please follow up with [facility].

## 2016-07-03 ENCOUNTER — Telehealth (HOSPITAL_BASED_OUTPATIENT_CLINIC_OR_DEPARTMENT_OTHER): Payer: Self-pay | Admitting: Obstetrics & Gynecology

## 2016-07-03 NOTE — Telephone Encounter (Signed)
Contacted patient to inform her the ultrasound findings were fine (no concern for placenta accreta) and she can proceed her care with [facility].  I reached out to Golden HurterBrielle Wacker, the manager at [facility].  She consulted with Dr. Berneda RoseMoran on this case and they are happy to see the patient today if she can arrive prior to 1pm to start the lam insertion.  Patient was encouraged to contact me at [phone] to confirm she received this information to continue care with [facility].

## 2016-10-01 ENCOUNTER — Encounter (HOSPITAL_BASED_OUTPATIENT_CLINIC_OR_DEPARTMENT_OTHER): Payer: Self-pay | Admitting: Gynecology

## 2021-04-08 IMAGING — MG MAMMO SCRN BIL W/CAD TOMO
8 of 12 series · 8 of 28 positions shown · non-contrast
Comparison: This is a baseline study.

Images Obtained from [facility]
INDICATION: Screening.
TECHNIQUE: Bilateral 2-D digital screening mammogram was performed followed by 3-D tomosynthesis.  Current study was also evaluated with a computer aided detection (CAD) system.
MAMMOGRAM FINDINGS:
There are scattered areas of fibroglandular density.
There are two focal asymmetries in the left breast at 12 o'clock at middle depth.
In the right breast, no suspicious masses, calcifications or other abnormalities are seen.

[R CC]
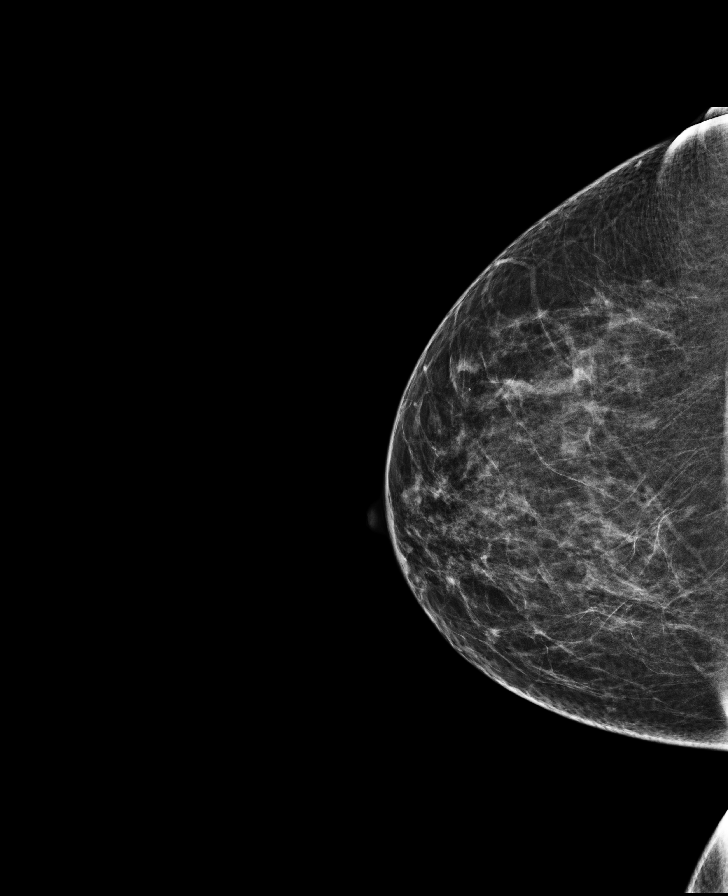

[R MLO]
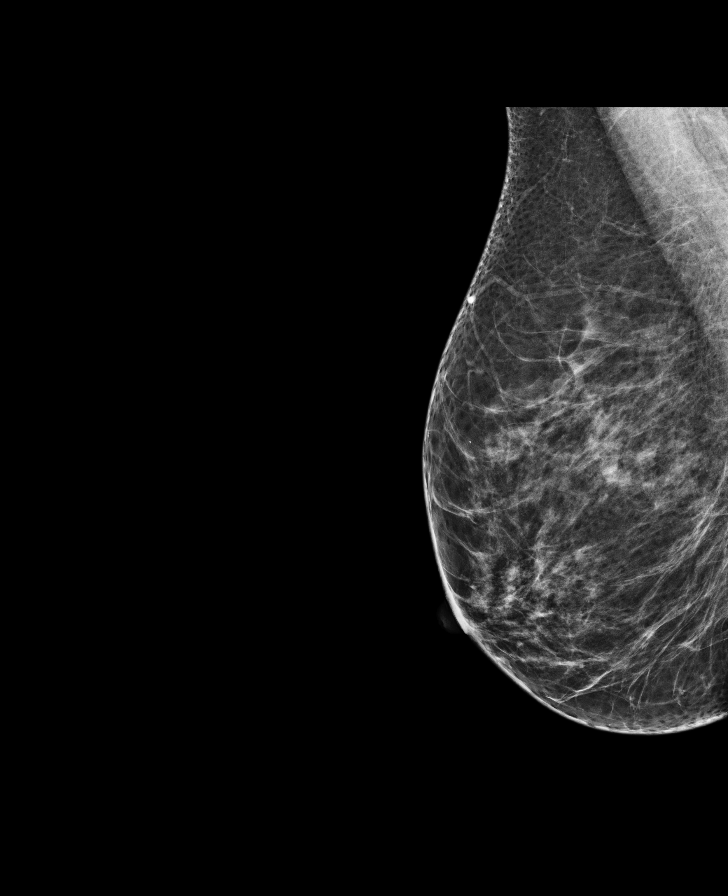

[L MLO (1 of 4)]
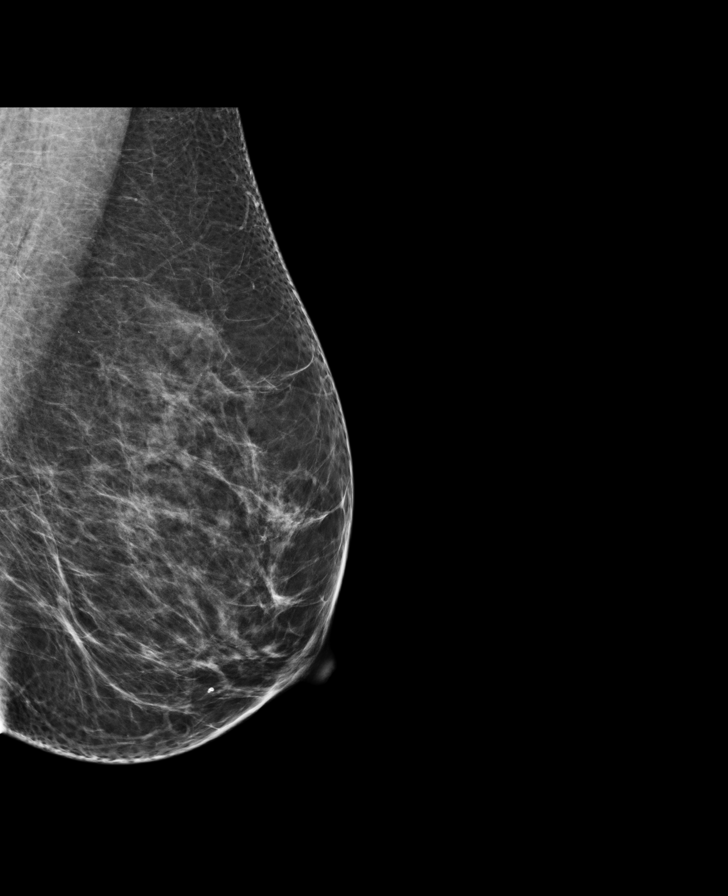

[L CC (1 of 2)]
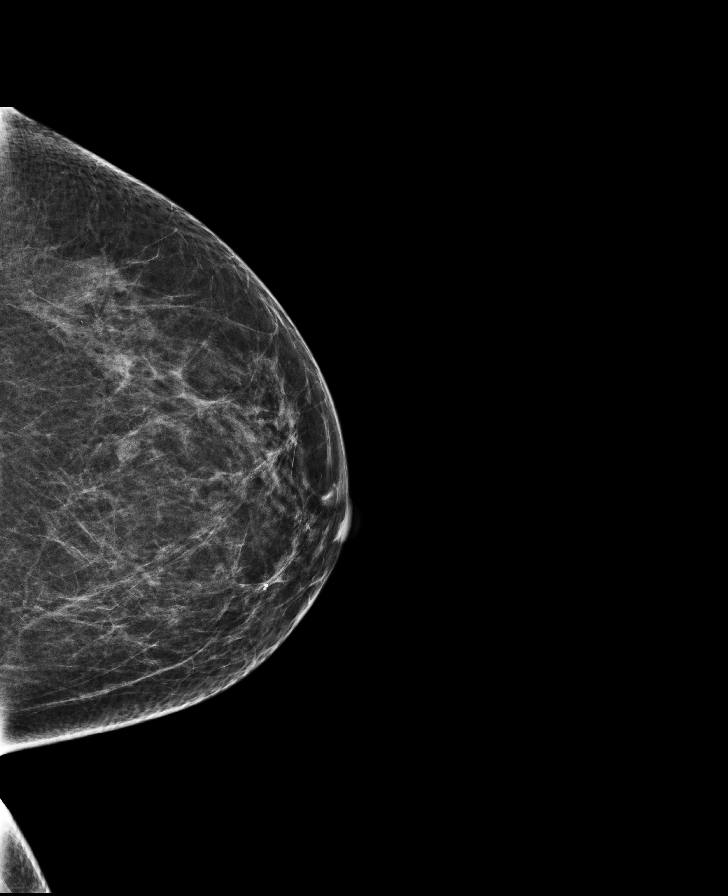

[L MLO (2 of 4)]
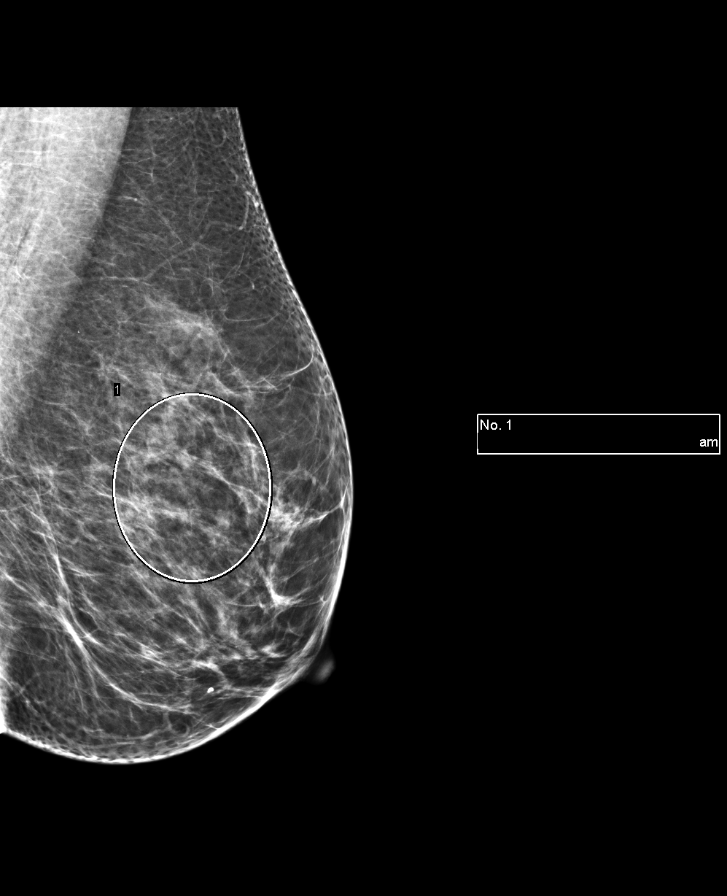

[L MLO (3 of 4)]
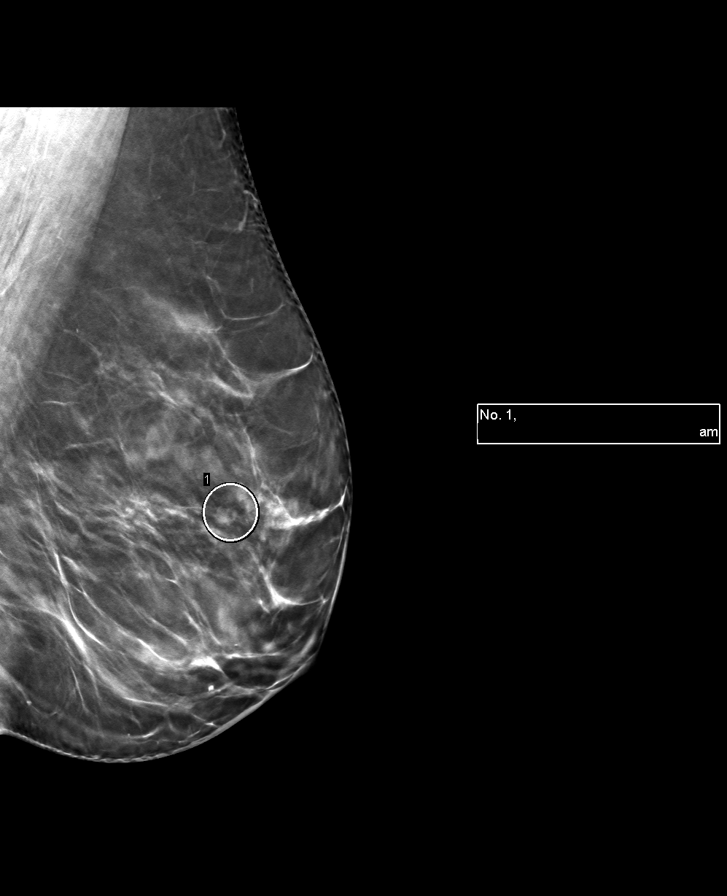

[L CC (2 of 2)]
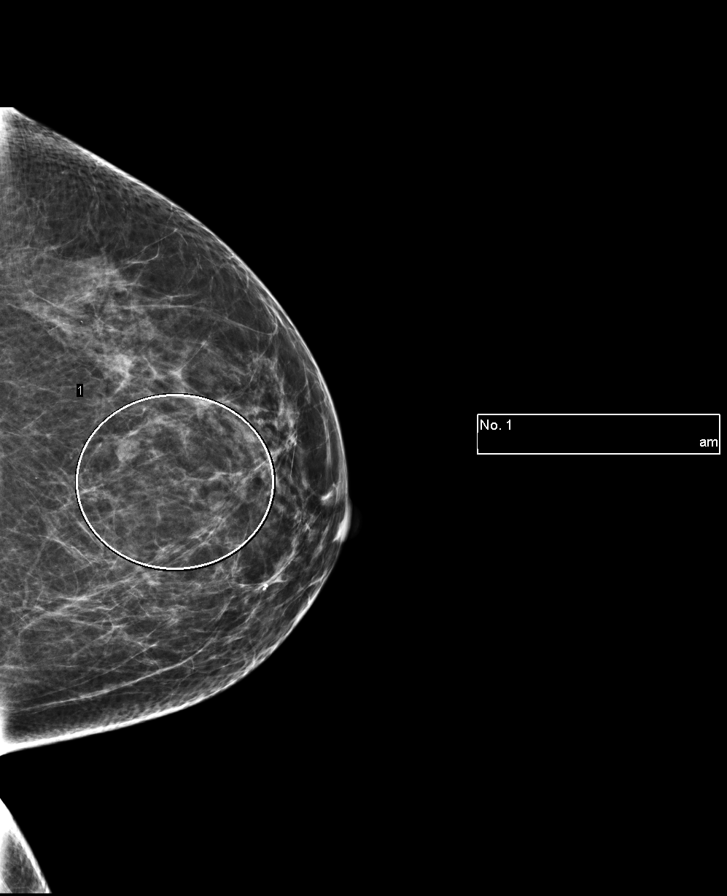

[L MLO (4 of 4)]
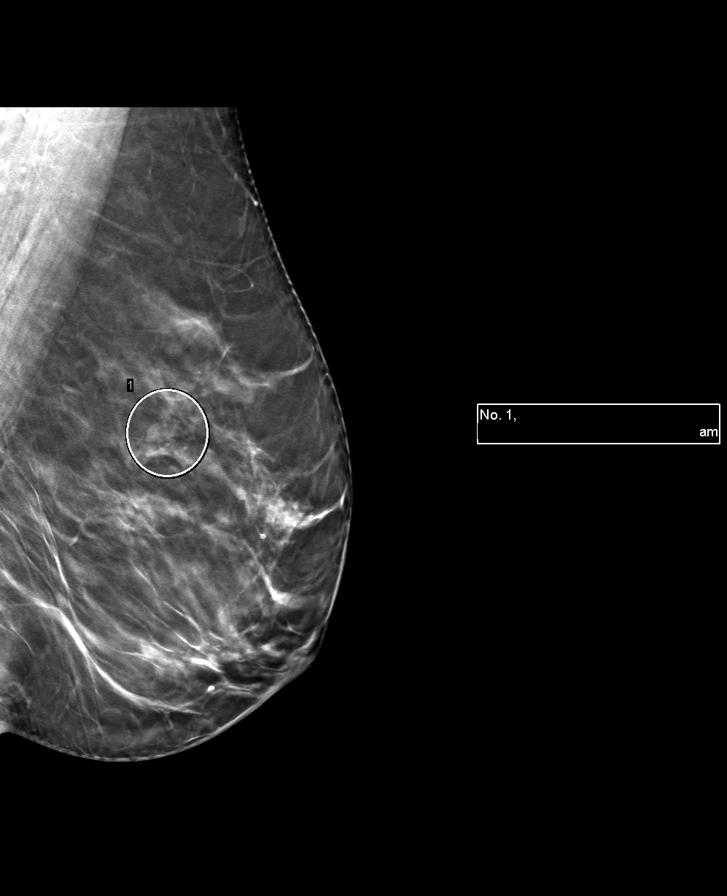

[8 of 28 positions shown; findings below may reference images not displayed]

IMPRESSION: Focal asymmetries in the left breast require additional evaluation. Diagnostic mammogram with possible ultrasound recommended.
BI-RADS Category 0: Incomplete: Needs Additional Imaging Evaluation

## 2021-04-21 IMAGING — MG MAMMO DIAG LT W/CAD TOMO
6 series · 6 of 18 positions shown · non-contrast
Comparison: 04/08/2021

Images Obtained from [facility]
INDICATION: Left breast asymmetries.
TECHNIQUE: Left 2-D digital diagnostic mammogram was performed followed by 3-D tomosynthesis. Current study was also evaluated with a computer aided detection (CAD) system. Targeted left  breast
ultrasound was also performed.

[L LM]
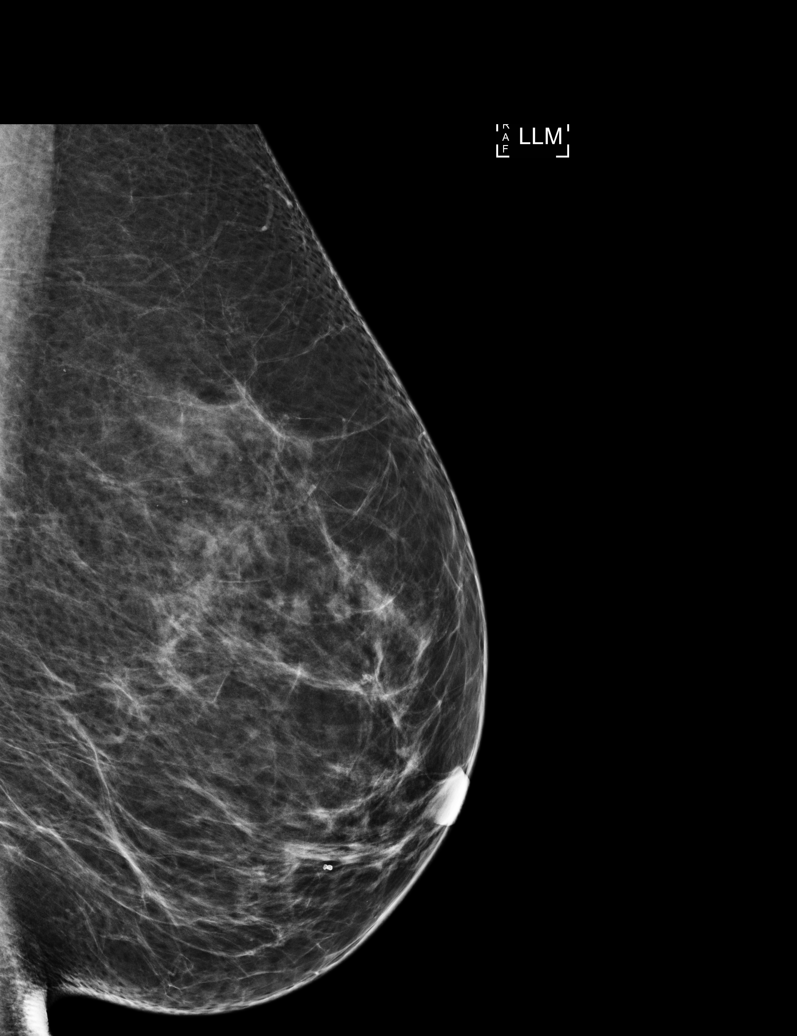

[L CC (1 of 2)]
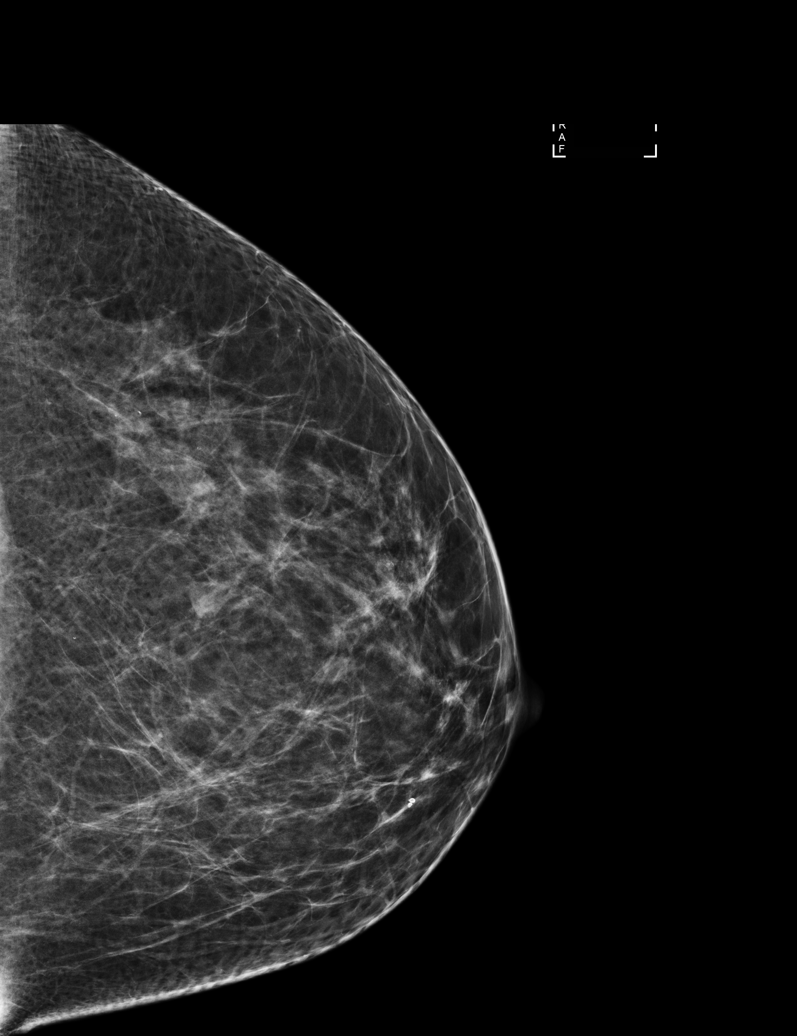

[L CC (2 of 2)]
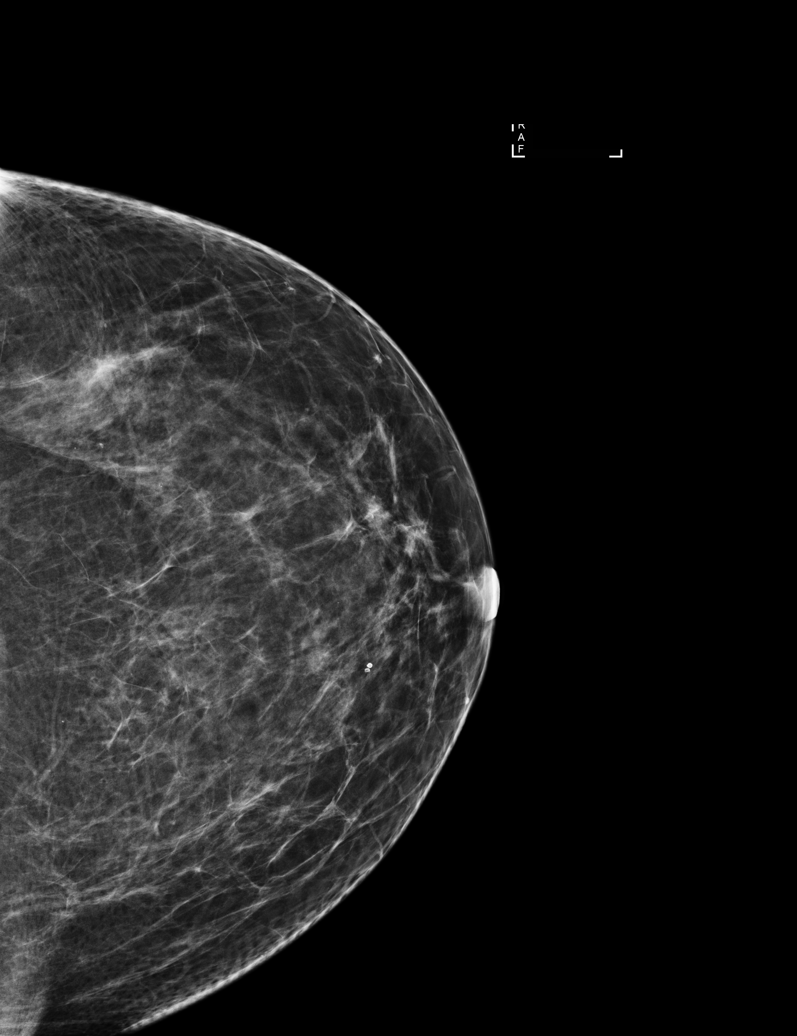

[L CC tomo (1 of 2) · tomo slice 39/78.0]
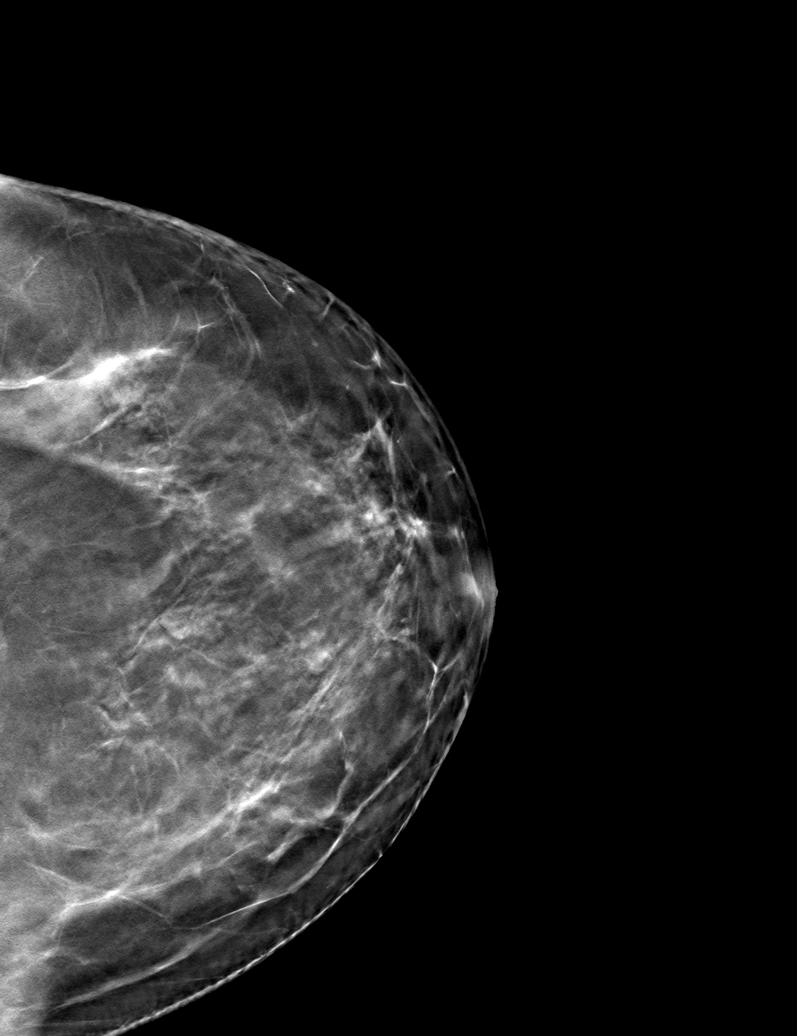

[L CC tomo (2 of 2) · tomo slice 40/79.0]
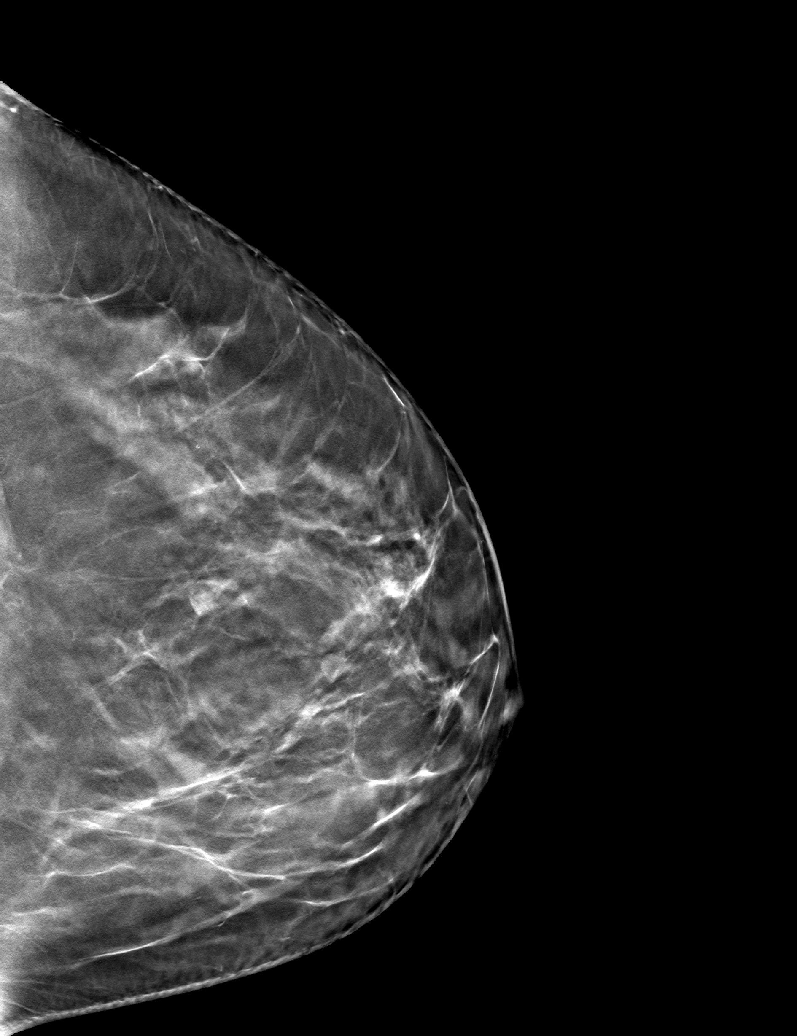

[L LM tomo · tomo slice 39/76.0]
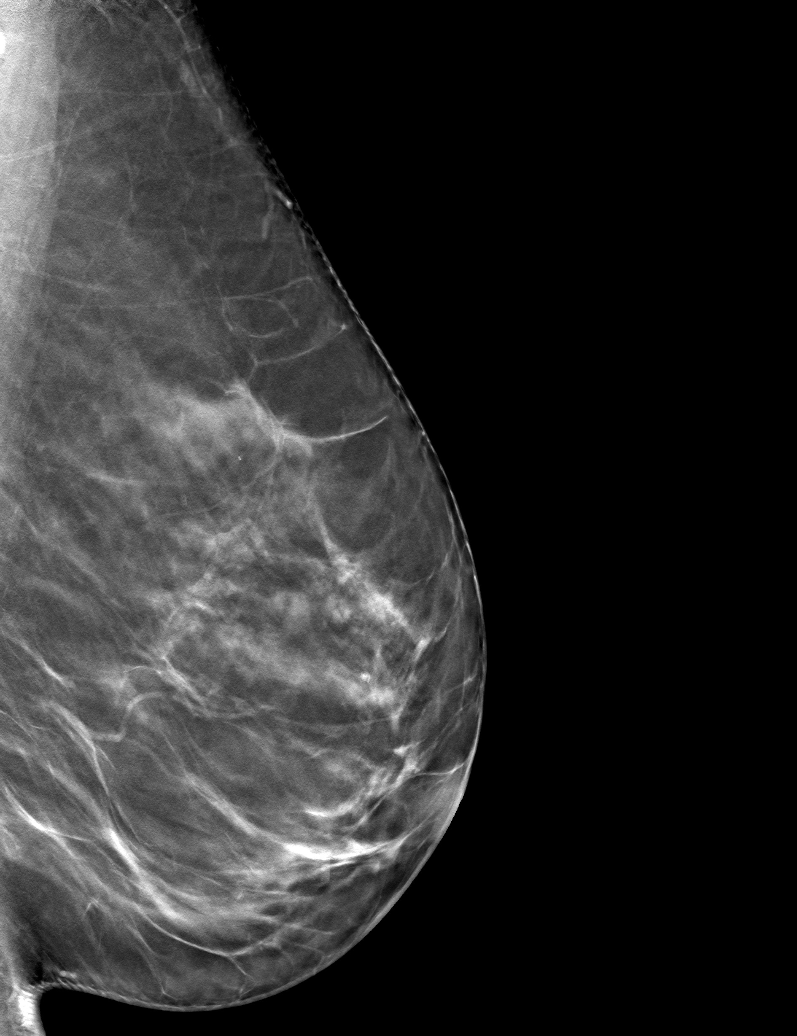

[6 of 18 positions shown; findings below may reference images not displayed]

FINDINGS: LEFT DIAGNOSTIC MAMMOGRAM:  There are scattered areas of fibroglandular density. Additional views demonstrate 2 persistent oval masses in the left breast at 11 o'clock middle depth. No other
significant findings.
TARGETED LEFT BREAST ULTRASOUND: In the left breast at 11 o'clock 7 cm from the nipple there is a hypoechoic lobulated mass measuring 0.5 x 0.4 x 0.4 cm. At 11 o'clock 7 cm from the nipple there is a
1.2 cm oval hypoechoic mass. These correspond to the mammographic findings. No enlarged left axillary lymph nodes.
IMPRESSION: The similar oval masses in the left breast at 11 o'clock middle depth most likely represent benign fibroadenomas or clustered cysts. Follow-up with left diagnostic mammogram and left breast
ultrasound in 6 months to assess stability.
Findings and recommendations were discussed with the patient and she understands the importance of follow-up.
FINAL ASSESSMENT: BI-RADS: Category 3 Probably benign

## 2021-04-21 IMAGING — US US BREAST LT LTD
1 series · 15 of 19 positions shown · non-contrast
Comparison: 04/08/2021

Images Obtained from [facility]
INDICATION: Left breast asymmetries.
TECHNIQUE: Left 2-D digital diagnostic mammogram was performed followed by 3-D tomosynthesis. Current study was also evaluated with a computer aided detection (CAD) system. Targeted left  breast
ultrasound was also performed.

[Series 1: us breast left ltd · 15 of 19 slices shown]
[im 1/19]
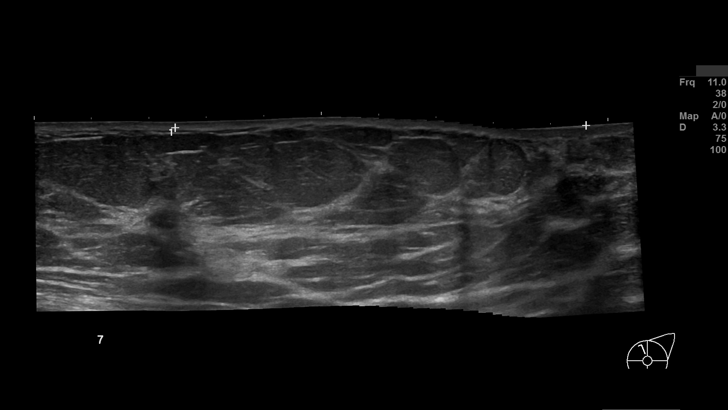
[im 2/19]
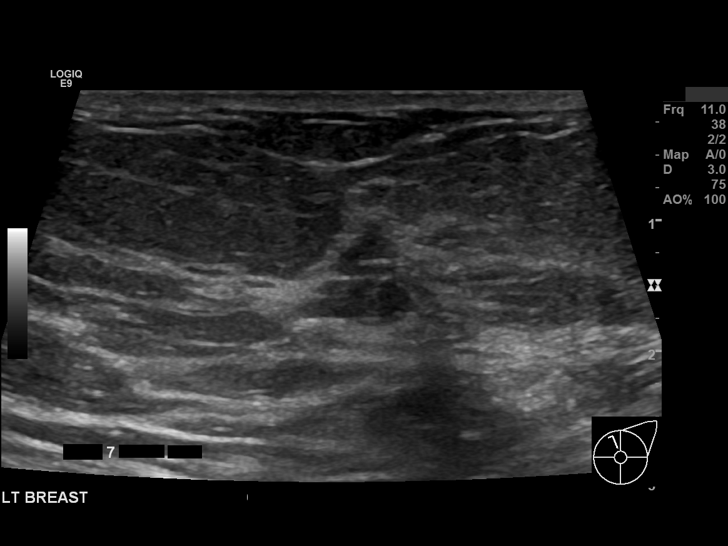
[im 4/19]
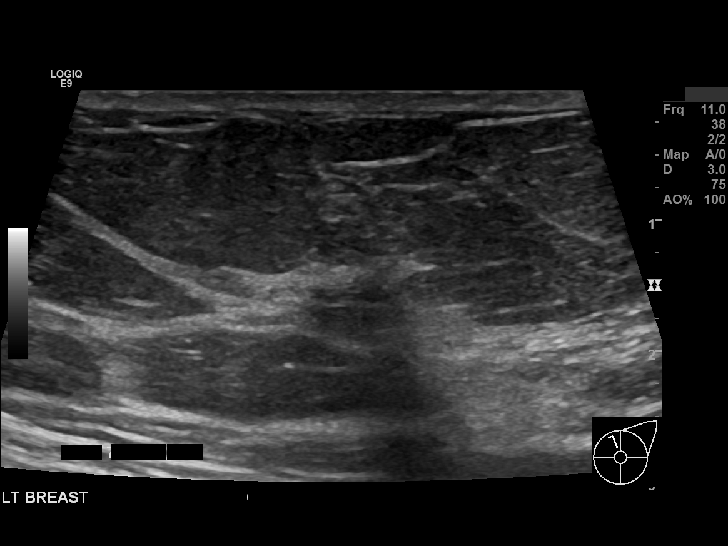
[im 5/19]
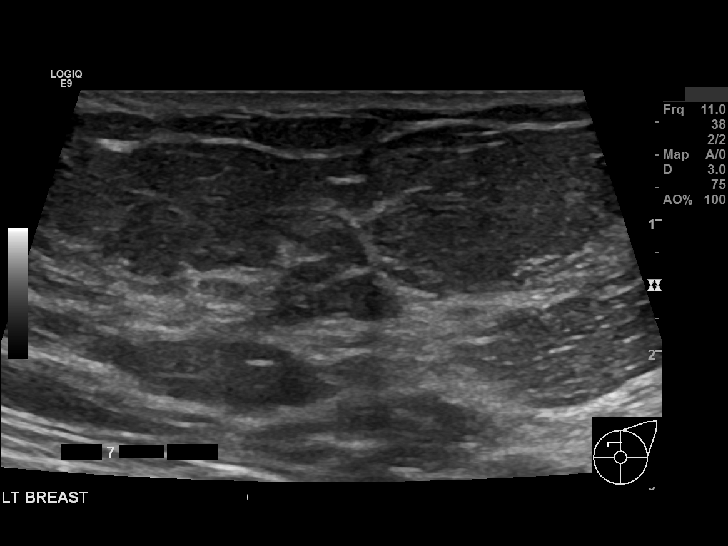
[im 6/19]
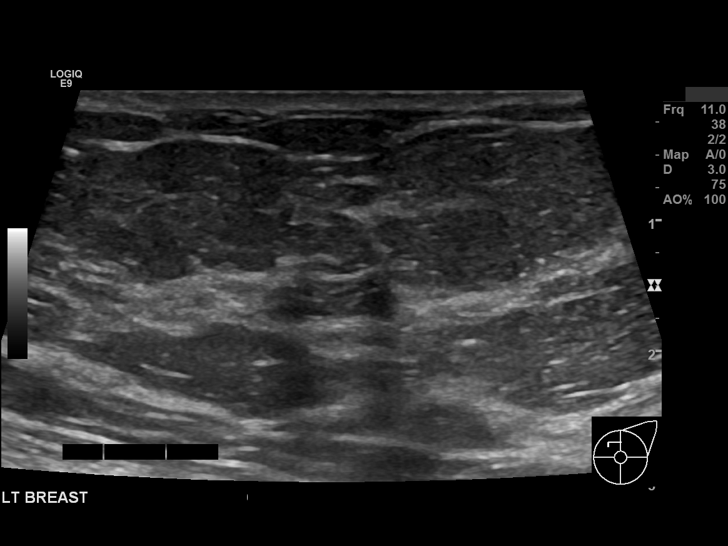
[im 7/19]
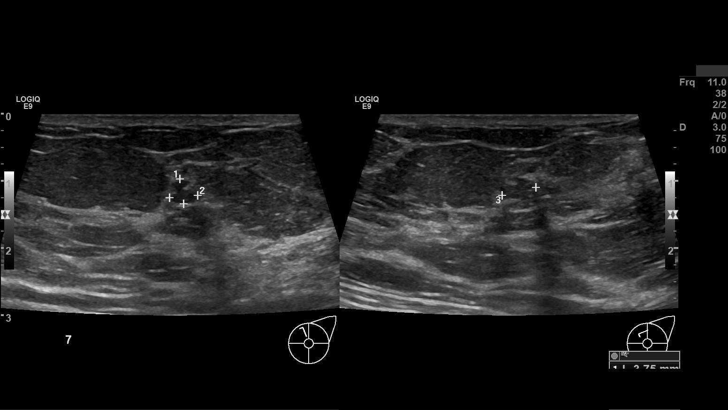
[im 9/19]
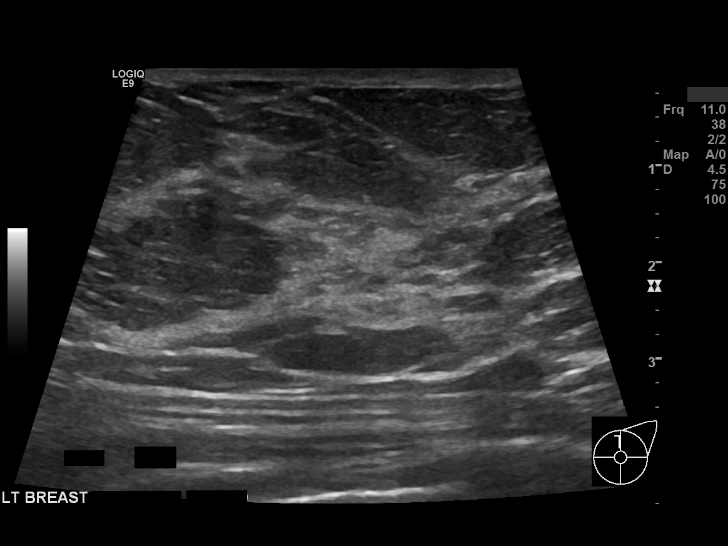
[im 10/19]
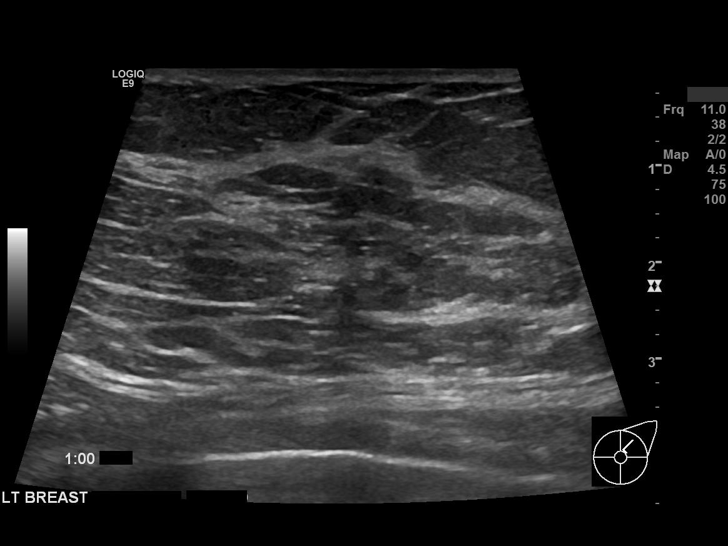
[im 11/19]
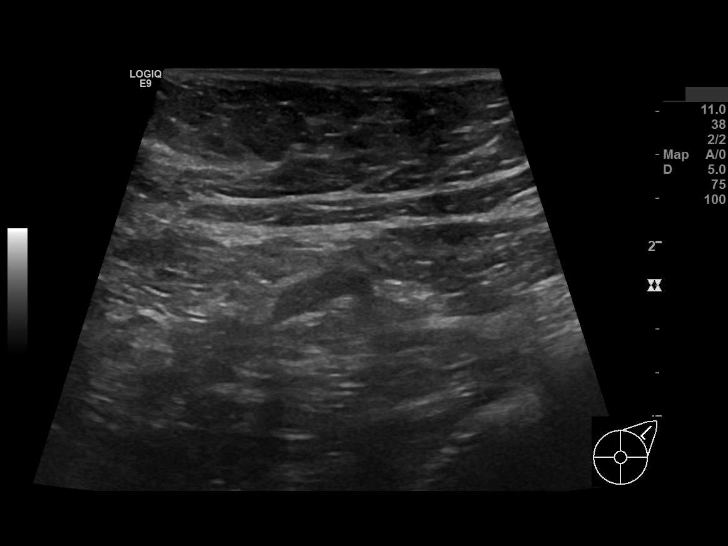
[im 13/19]
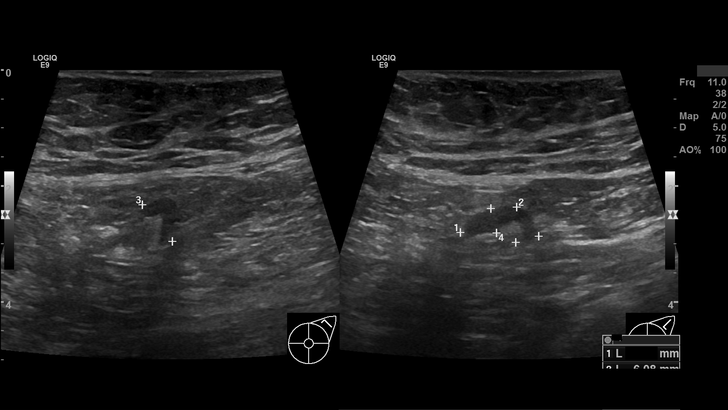
[im 14/19]
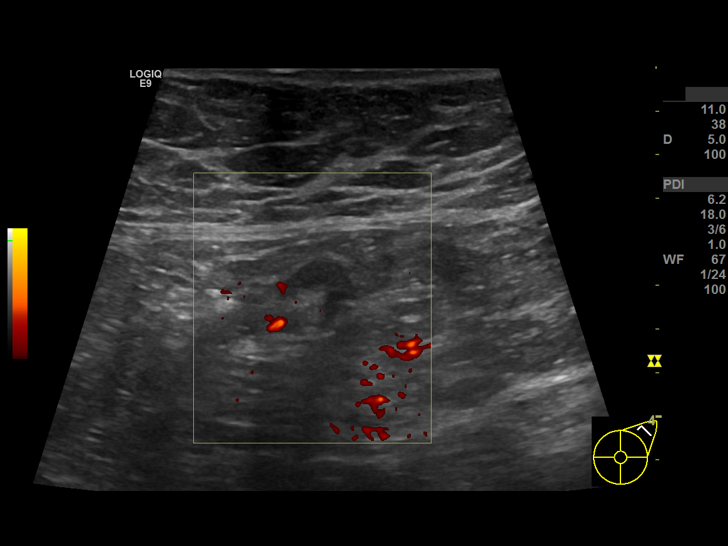
[im 15/19]
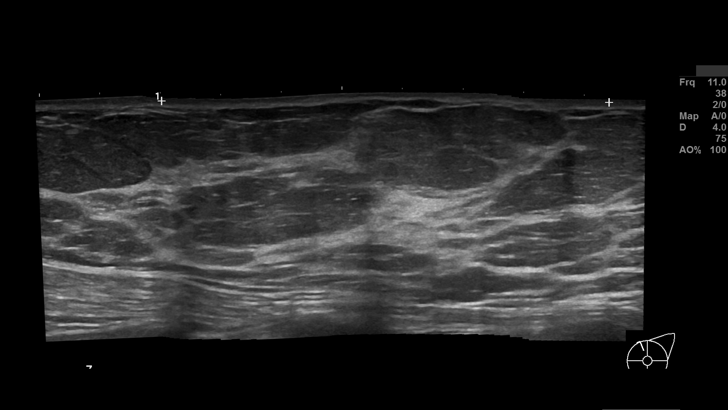
[im 16/19]
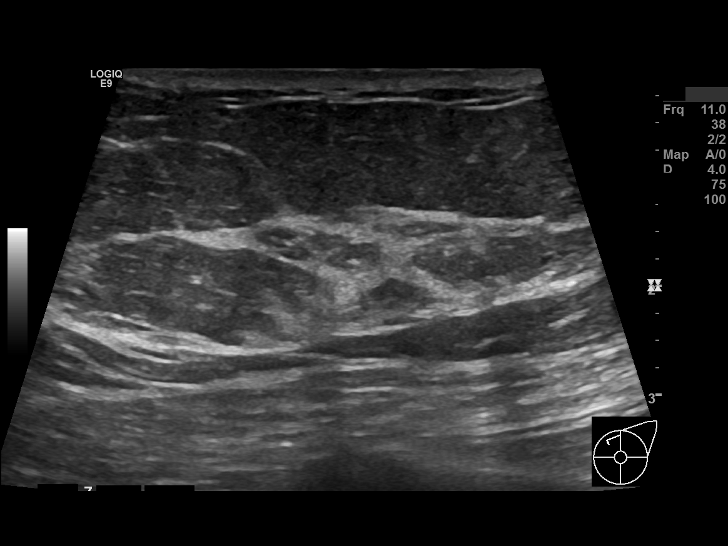
[im 18/19]
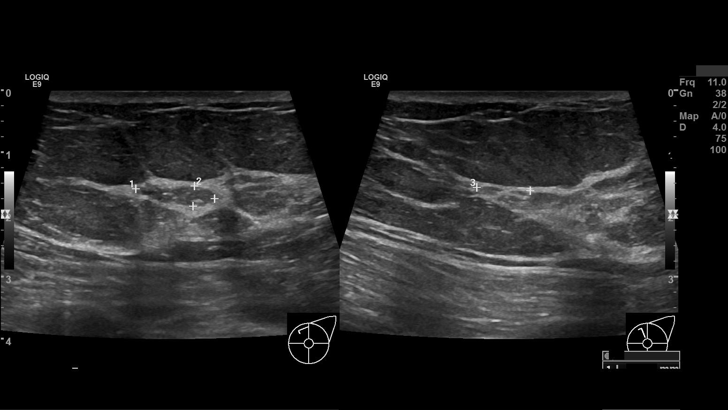
[im 19/19]
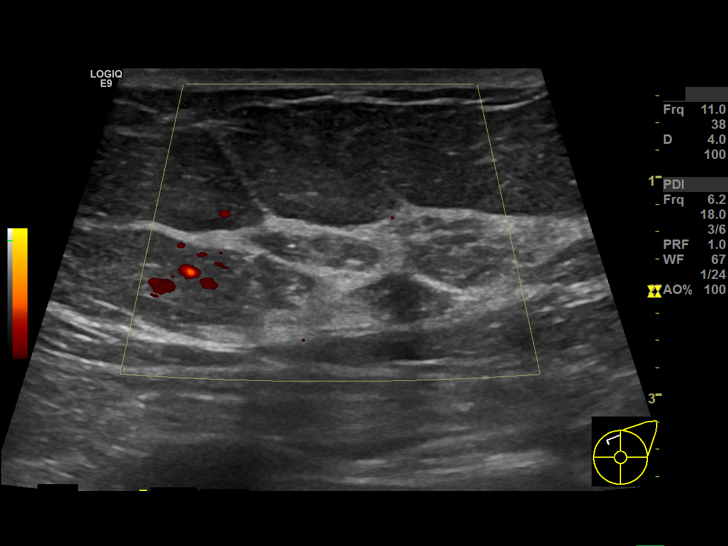

[15 of 19 positions shown; findings below may reference images not displayed]

FINDINGS: LEFT DIAGNOSTIC MAMMOGRAM:  There are scattered areas of fibroglandular density. Additional views demonstrate 2 persistent oval masses in the left breast at 11 o'clock middle depth. No other
significant findings.
TARGETED LEFT BREAST ULTRASOUND: In the left breast at 11 o'clock 7 cm from the nipple there is a hypoechoic lobulated mass measuring 0.5 x 0.4 x 0.4 cm. At 11 o'clock 7 cm from the nipple there is a
1.2 cm oval hypoechoic mass. These correspond to the mammographic findings. No enlarged left axillary lymph nodes.
IMPRESSION: The similar oval masses in the left breast at 11 o'clock middle depth most likely represent benign fibroadenomas or clustered cysts. Follow-up with left diagnostic mammogram and left breast
ultrasound in 6 months to assess stability.
Findings and recommendations were discussed with the patient and she understands the importance of follow-up.
FINAL ASSESSMENT: BI-RADS: Category 3 Probably benign

## 2021-09-13 IMAGING — MG MAMMO DIAG LT W/CAD TOMO
6 series · 6 of 18 positions shown · non-contrast
Comparison: Multiple prior exams most recently 04/21/2021

INDICATION: Follow-up for left breast masses.
TECHNIQUE: Left 2-D digital diagnostic mammogram was performed followed by 3-D tomosynthesis. Current study was also evaluated with a computer aided detection (CAD) system. Targeted left  breast ultrasound was also performed.

[L MLO]
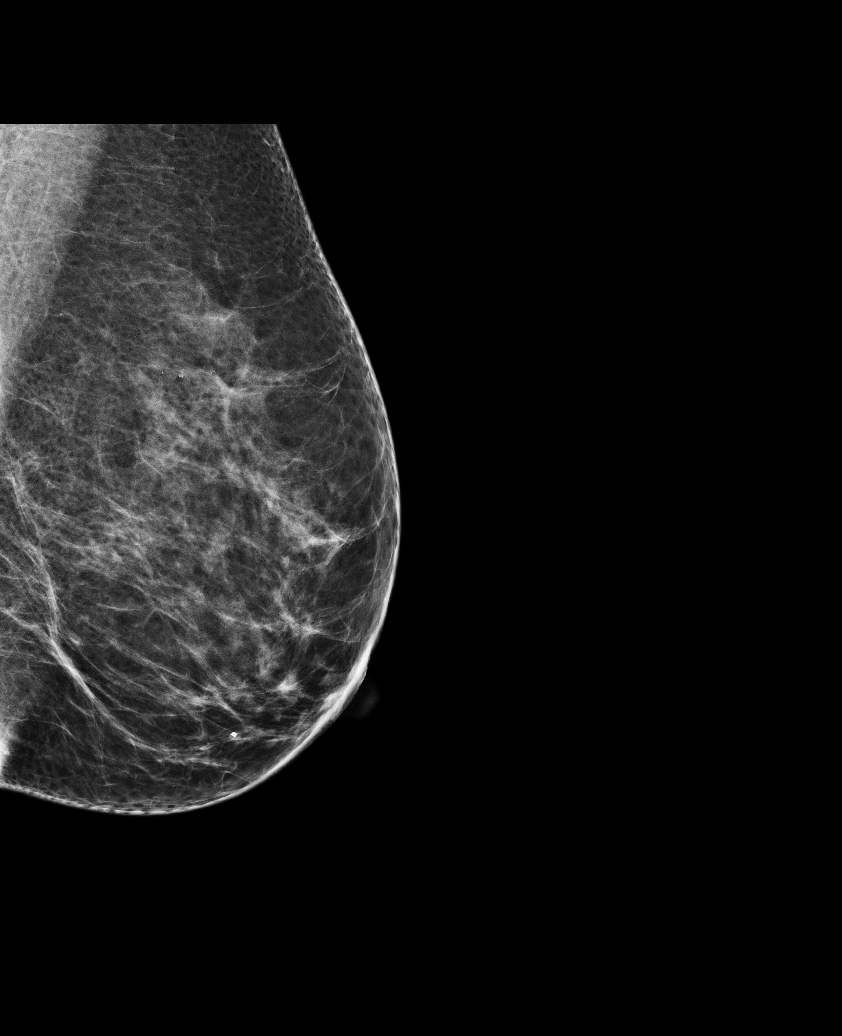

[L LM]
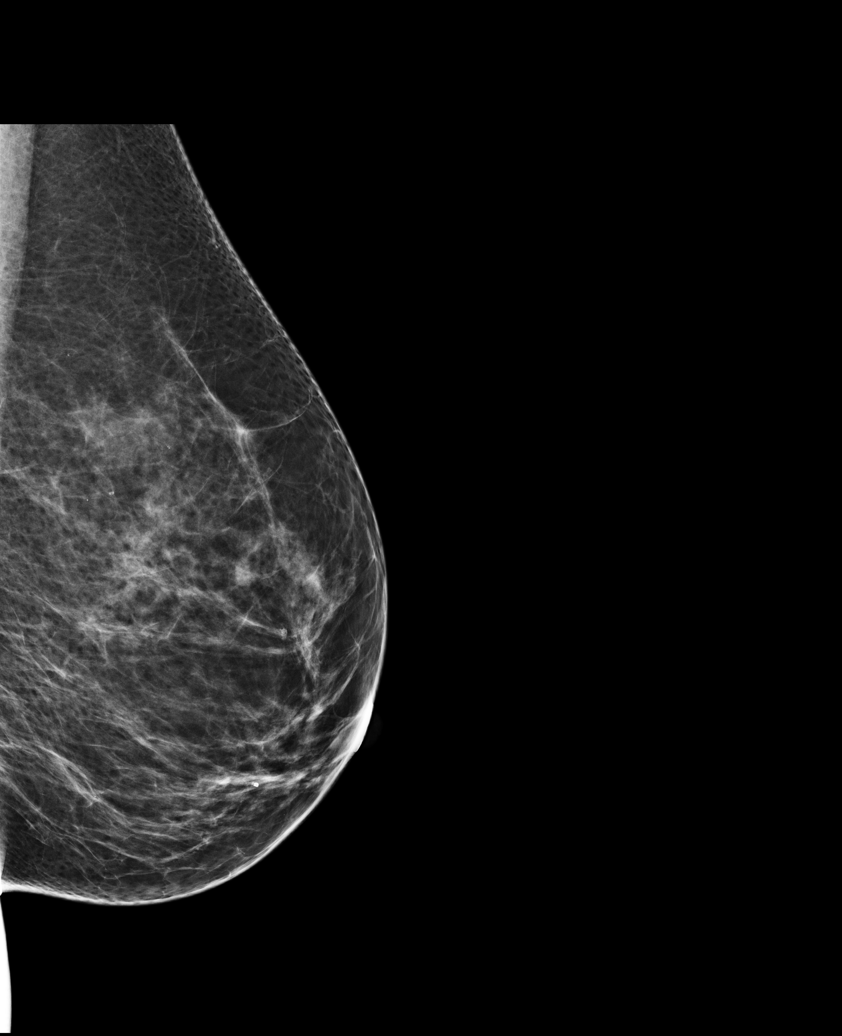

[L CC]
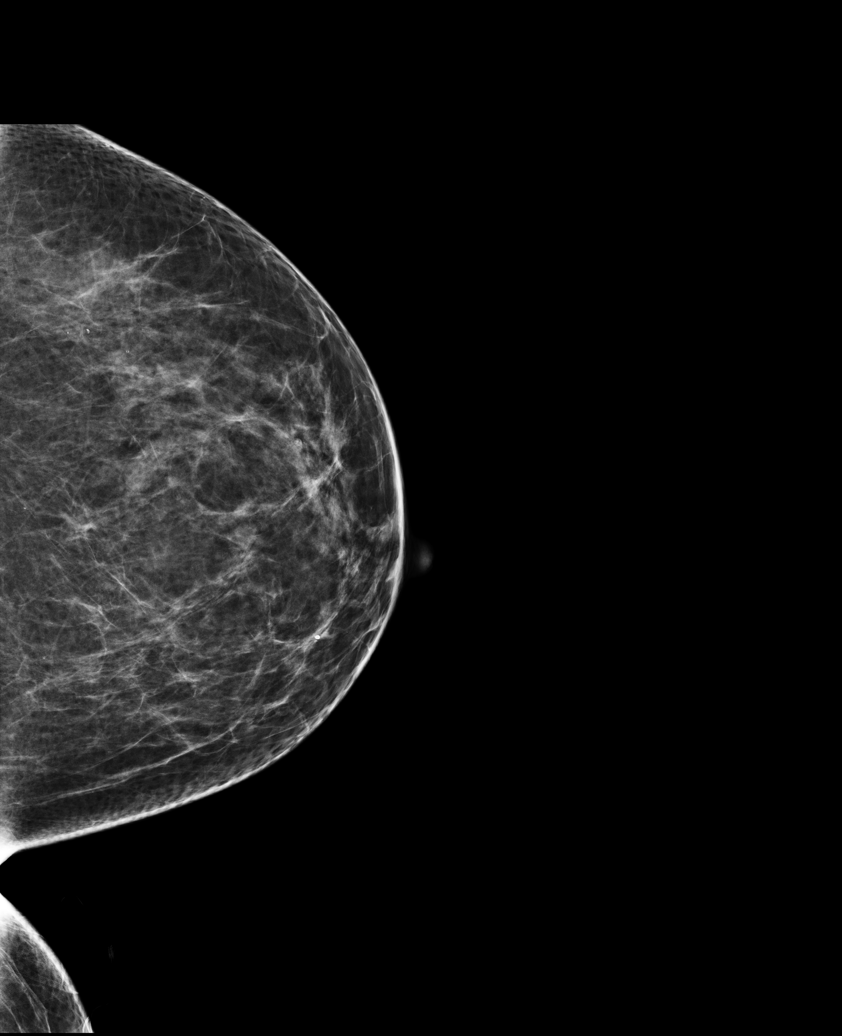

[L LM tomo · tomo slice 36/71.0]
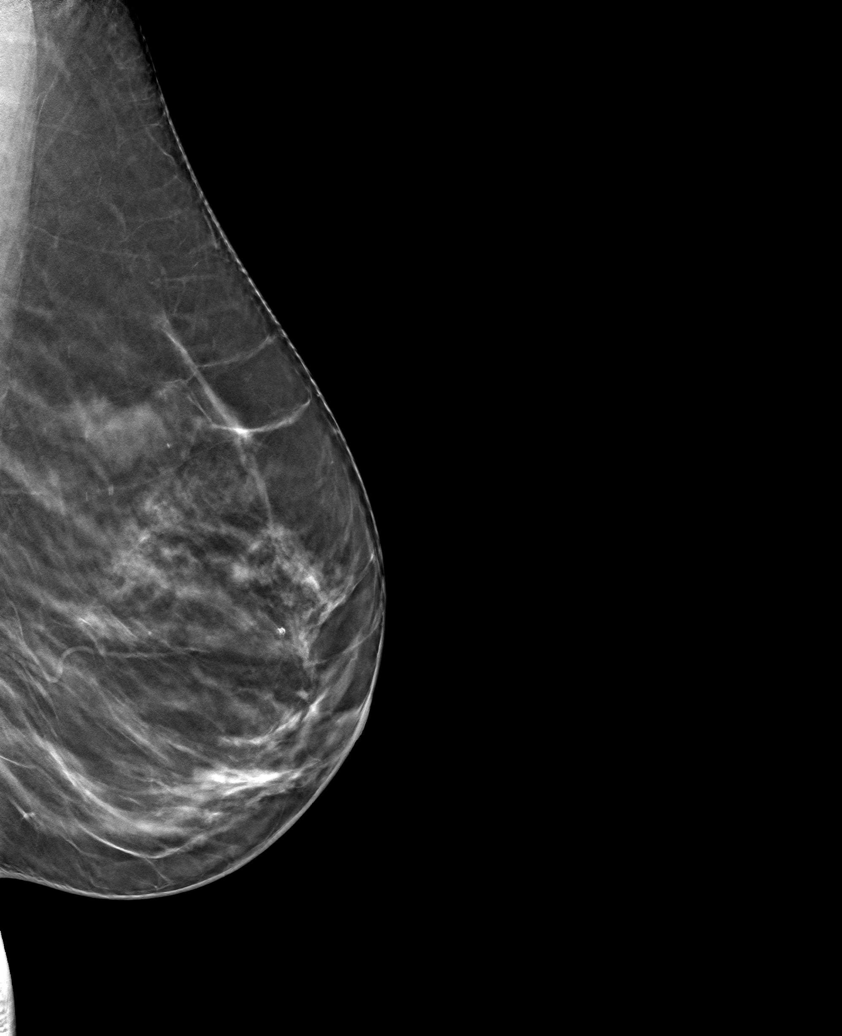

[L MLO tomo · tomo slice 40/79.0]
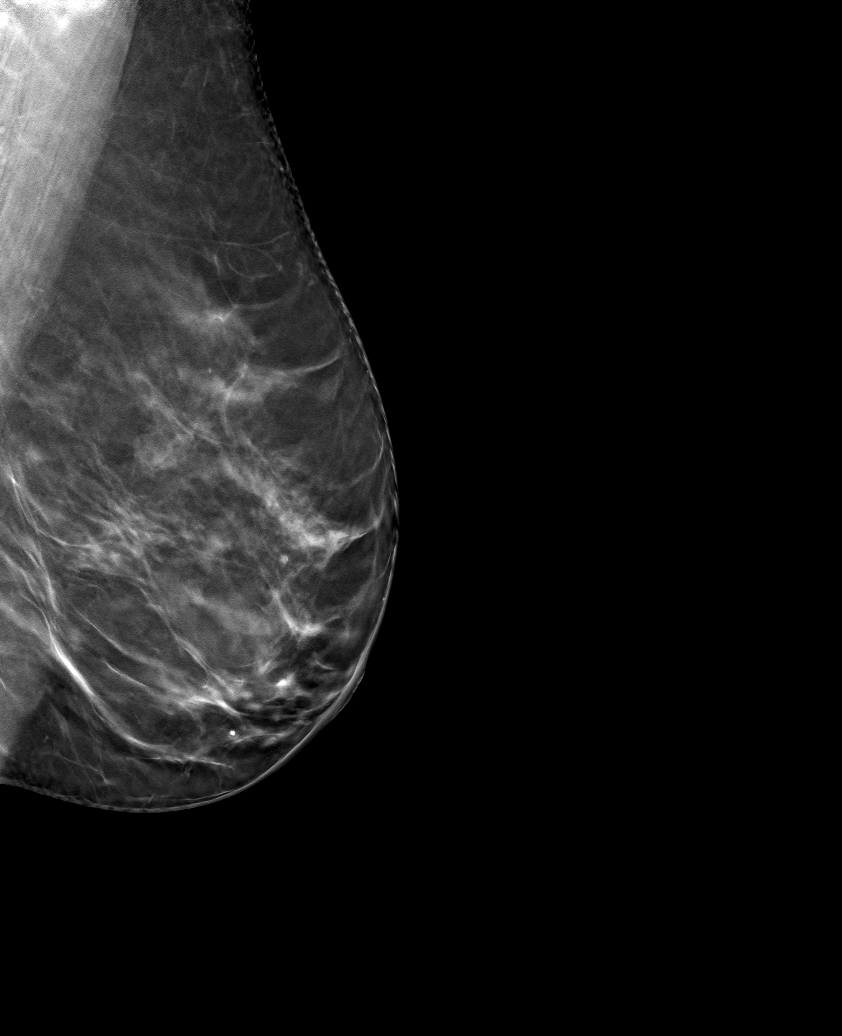

[L CC tomo · tomo slice 37/73.0]
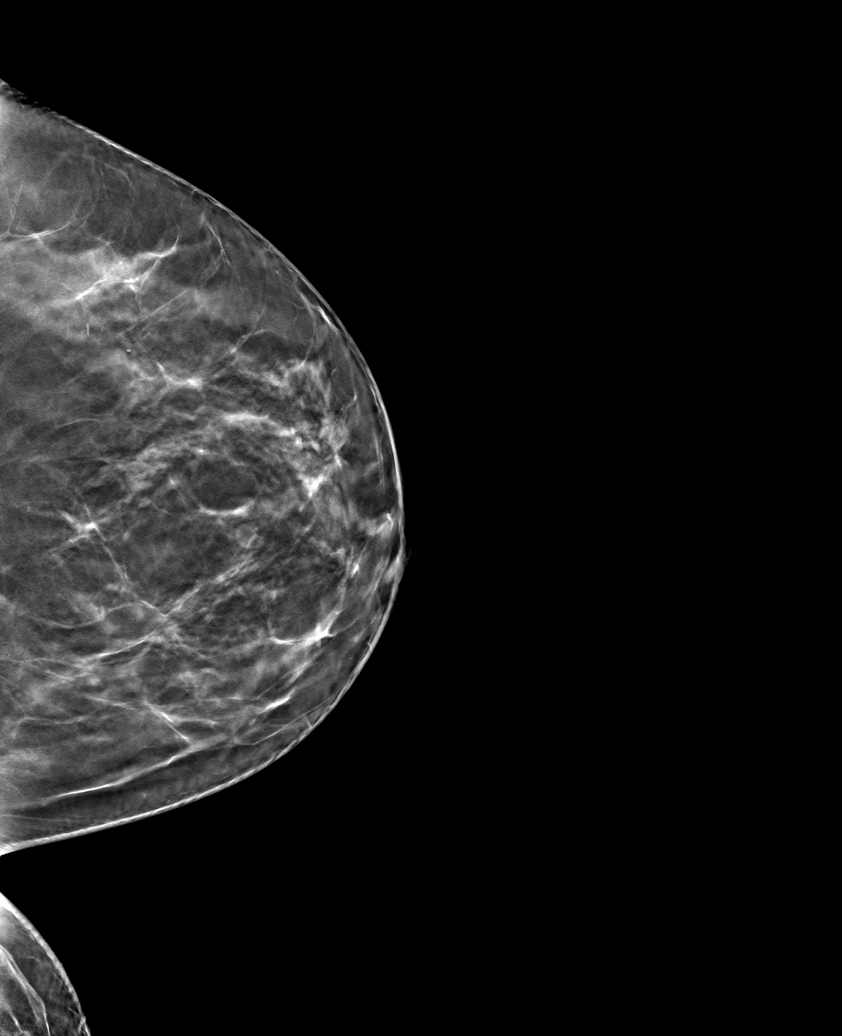

[6 of 18 positions shown; findings below may reference images not displayed]

FINDINGS: LEFT DIAGNOSTIC MAMMOGRAM:  There are scattered areas of fibroglandular density. The previously described asymmetries are no longer seen consistent with tissue summation. No suspicious findings are seen.

TARGETED LEFT BREAST ULTRASOUND: No sonographic abnormality was seen in the palpable area of concern in the lower outer quadrant of the left breast. The previously seen masses were not reproducible on today's examination and may have represented fibroglandular tissue or clustered cysts which have since resolved. No suspicious findings are seen.
IMPRESSION: There is no mammographic or targeted sonographic evidence of malignancy. Follow-up with annual screening mammogram at age 39.  

Findings and recommendations were discussed with the patient. The planned biopsy for today was canceled because the previous findings were not reproducible.

FINAL ASSESSMENT: BI-RADS: Category 1 Negative

## 2021-09-13 IMAGING — US US BREAST LT LTD
1 series · 13 of 13 positions shown · non-contrast
Comparison: Multiple prior exams most recently 04/21/2021

INDICATION: Follow-up for left breast masses.
TECHNIQUE: Left 2-D digital diagnostic mammogram was performed followed by 3-D tomosynthesis. Current study was also evaluated with a computer aided detection (CAD) system. Targeted left  breast ultrasound was also performed.

[Series 1: us breast left ltd · 13 of 13 slices shown]
[im 1/13]
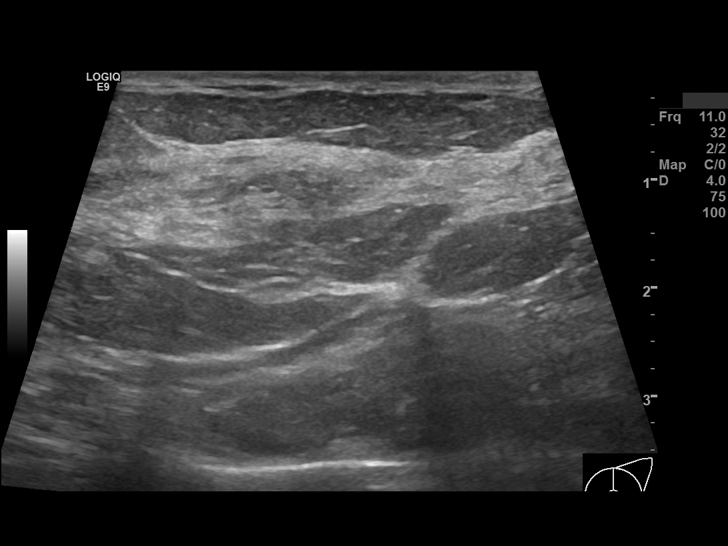
[im 2/13]
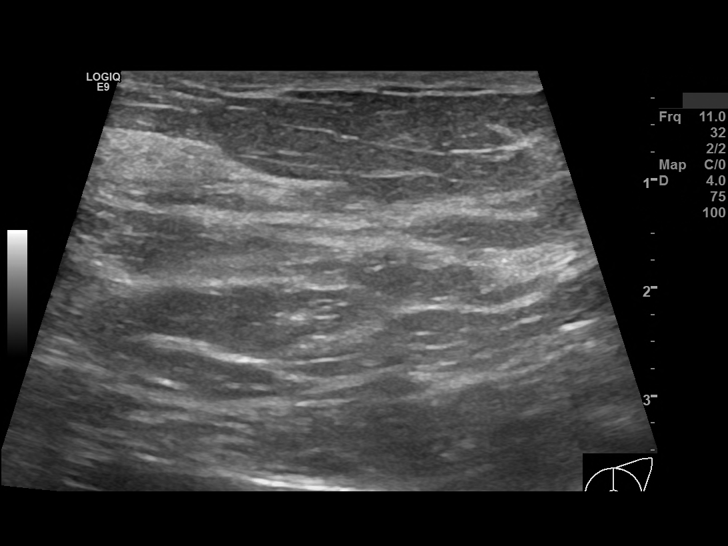
[im 3/13]
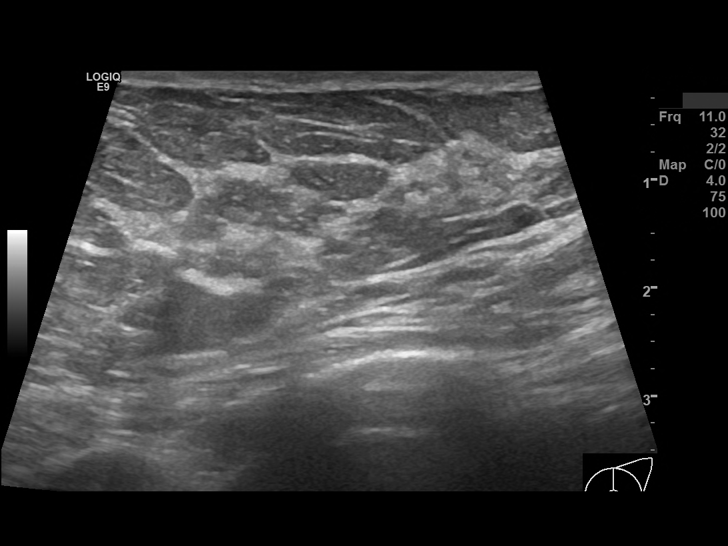
[im 4/13]
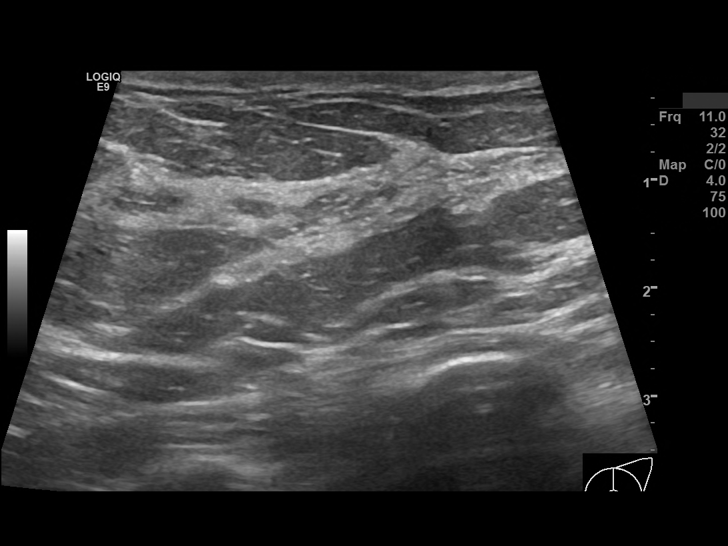
[im 5/13]
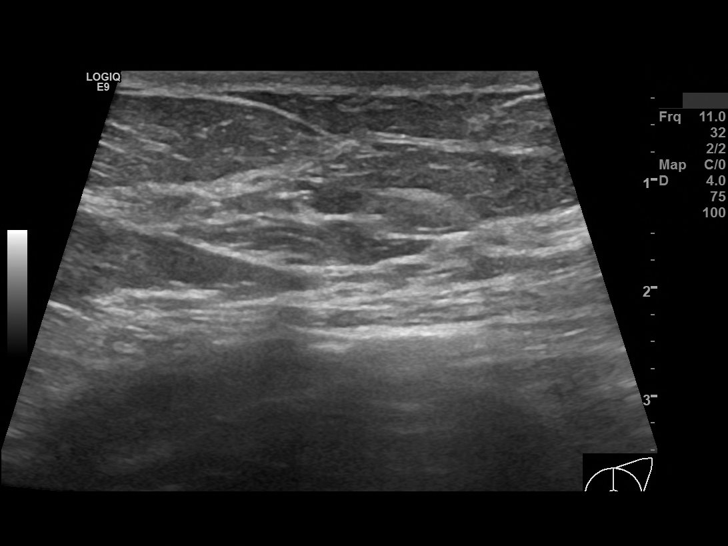
[im 6/13]
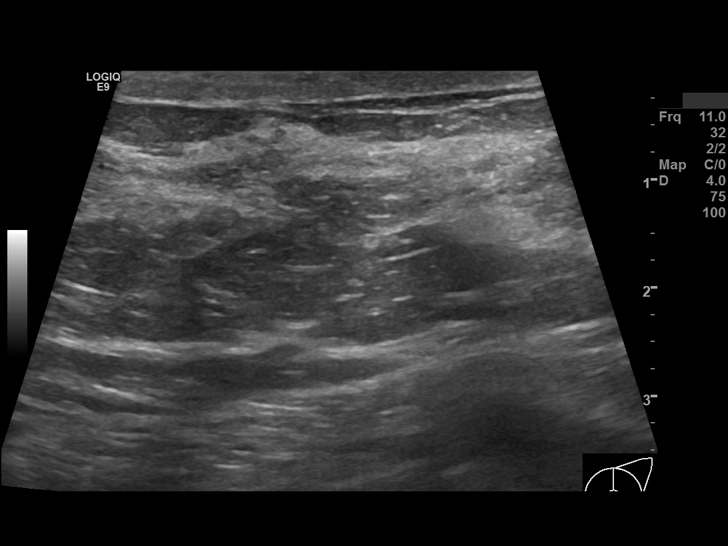
[im 7/13]
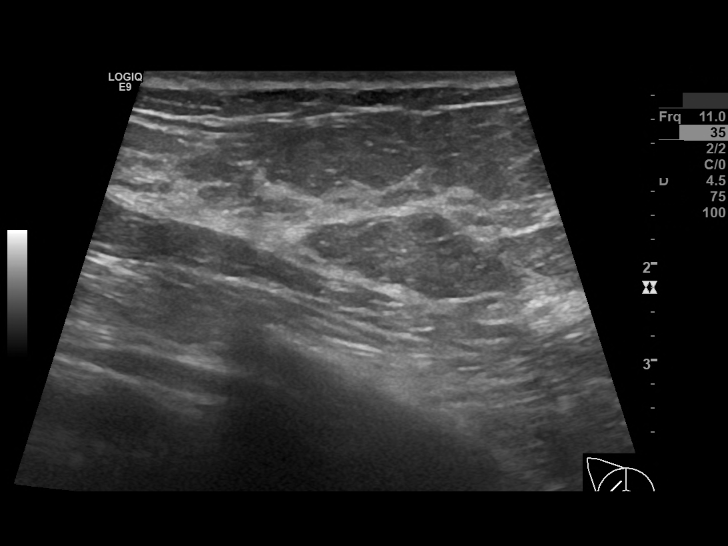
[im 8/13]
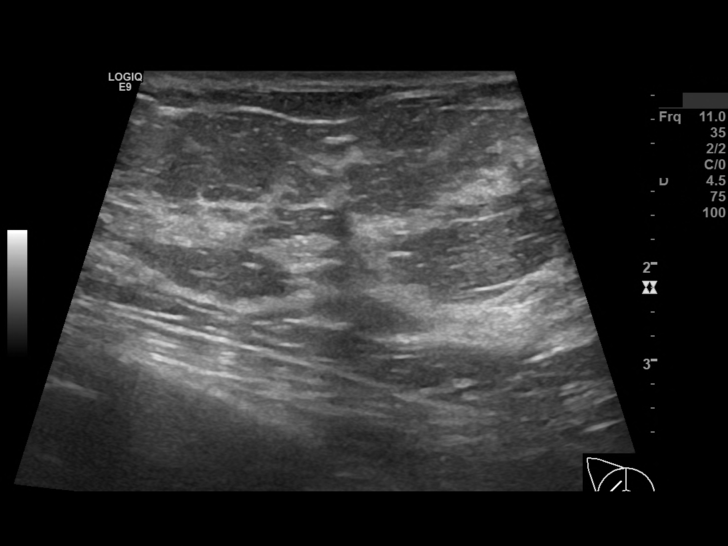
[im 9/13]
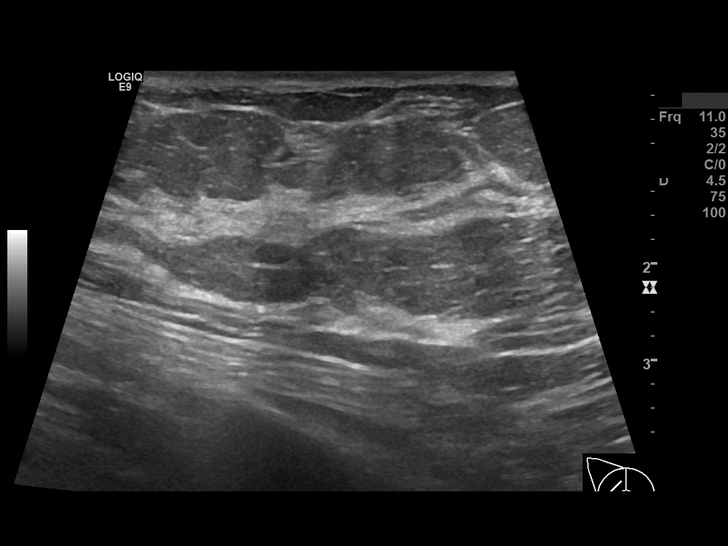
[im 10/13]
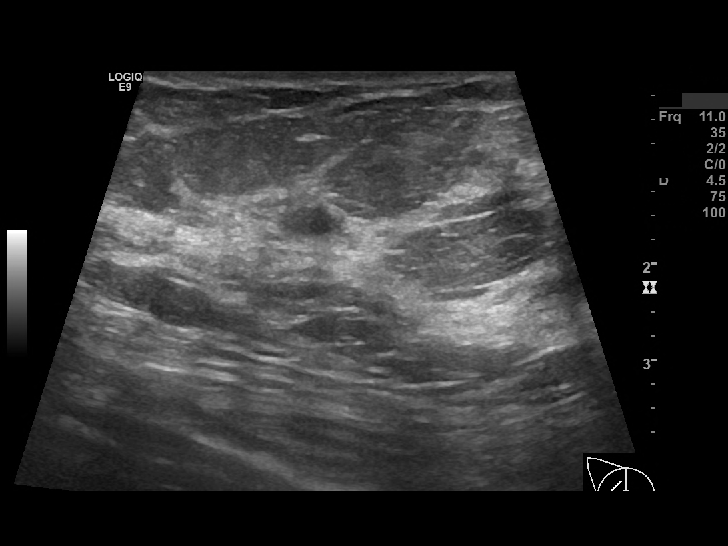
[im 11/13]
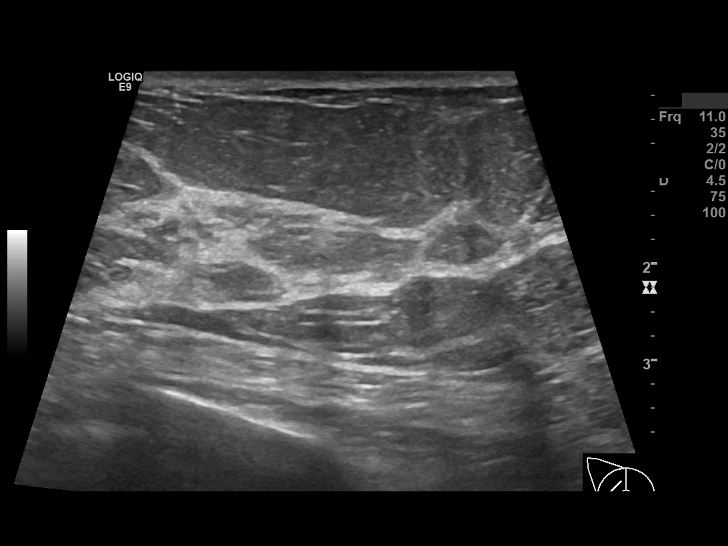
[im 12/13]
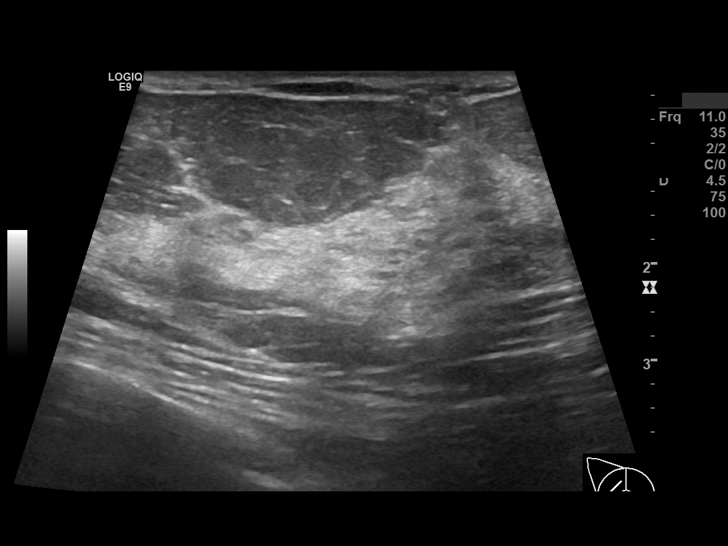
[im 13/13]
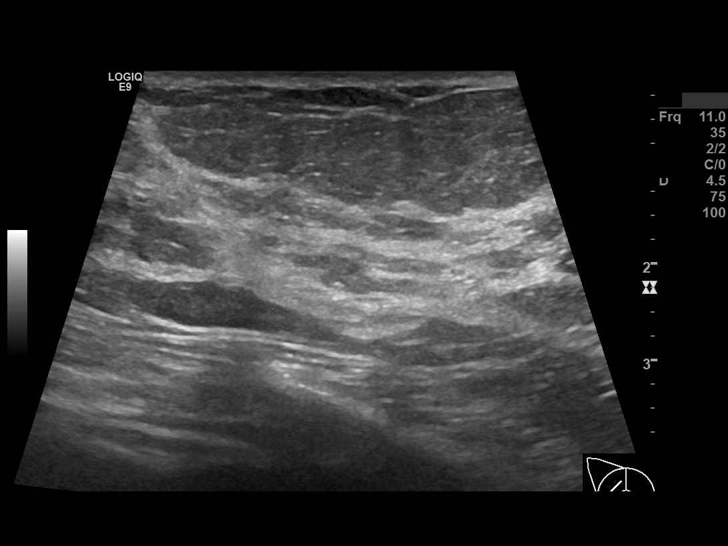

[13 of 13 positions shown; findings below may reference images not displayed]

FINDINGS: LEFT DIAGNOSTIC MAMMOGRAM:  There are scattered areas of fibroglandular density. The previously described asymmetries are no longer seen consistent with tissue summation. No suspicious findings are seen.

TARGETED LEFT BREAST ULTRASOUND: No sonographic abnormality was seen in the palpable area of concern in the lower outer quadrant of the left breast. The previously seen masses were not reproducible on today's examination and may have represented fibroglandular tissue or clustered cysts which have since resolved. No suspicious findings are seen.
IMPRESSION: There is no mammographic or targeted sonographic evidence of malignancy. Follow-up with annual screening mammogram at age 39.  

Findings and recommendations were discussed with the patient. The planned biopsy for today was canceled because the previous findings were not reproducible.

FINAL ASSESSMENT: BI-RADS: Category 1 Negative

## 2021-10-04 IMAGING — US LIVER ELASTOGRAPHY
1 series · 10 of 10 positions shown · non-contrast
Comparison: None.

HISTORY: 35 year-old female with fatty liver.
TECHNIQUE: Using real-time and Doppler ultrasound, the liver and gallbladder were evaluated.

[Series 1: liver elastography · 10 of 10 slices shown]
[im 1/10]
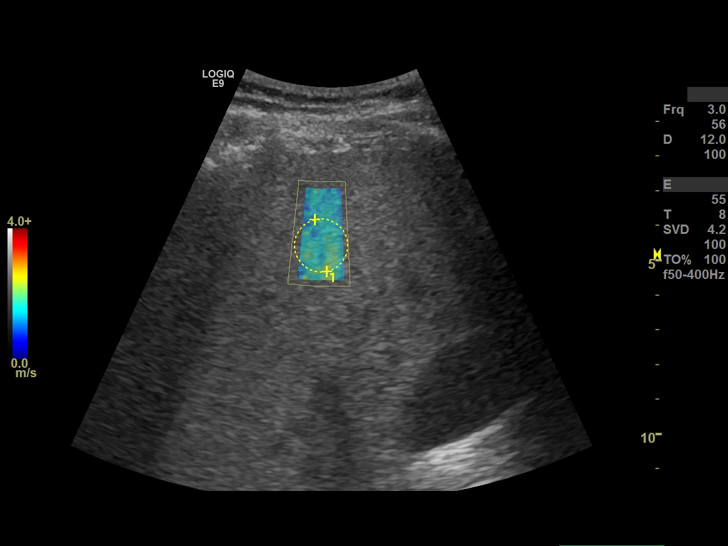
[im 2/10]
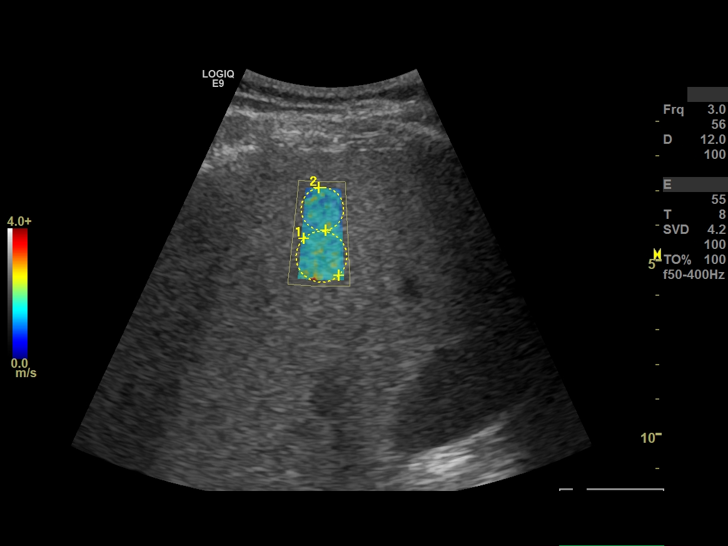
[im 3/10]
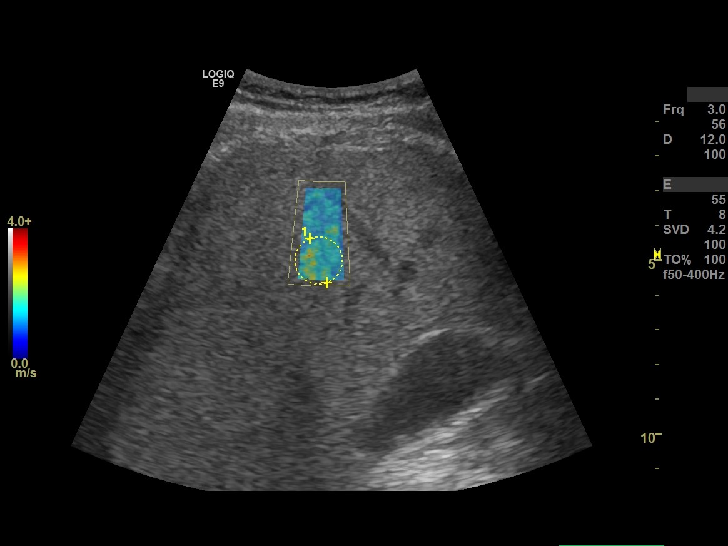
[im 4/10]
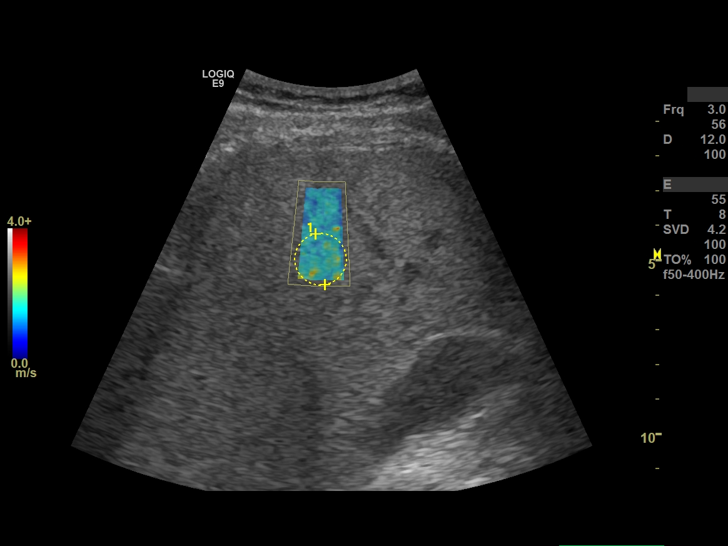
[im 5/10]
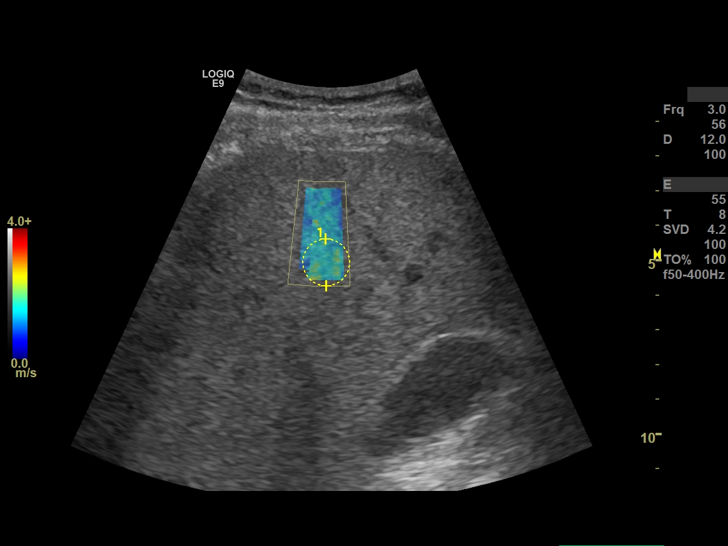
[im 6/10]
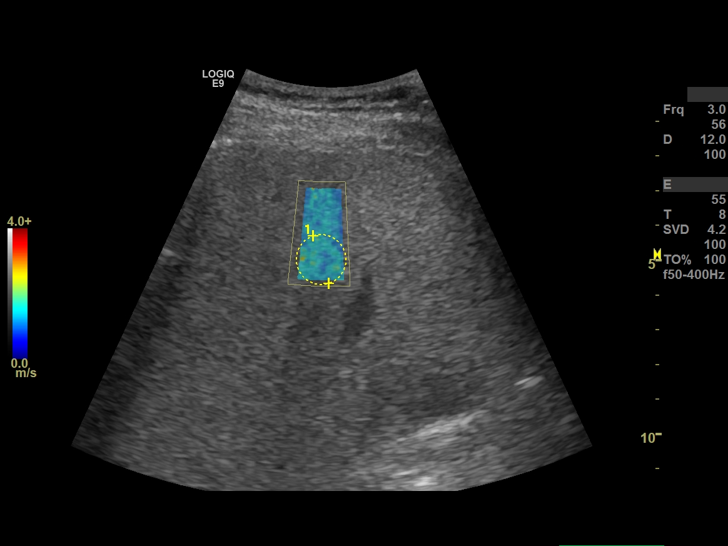
[im 7/10]
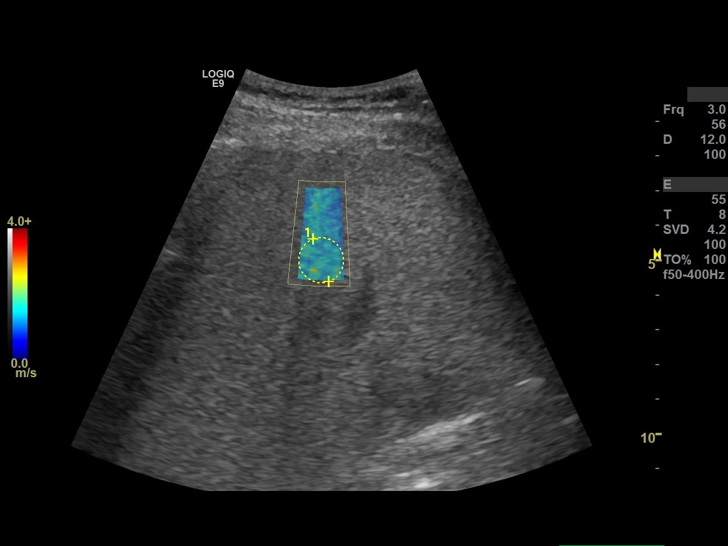
[im 8/10]
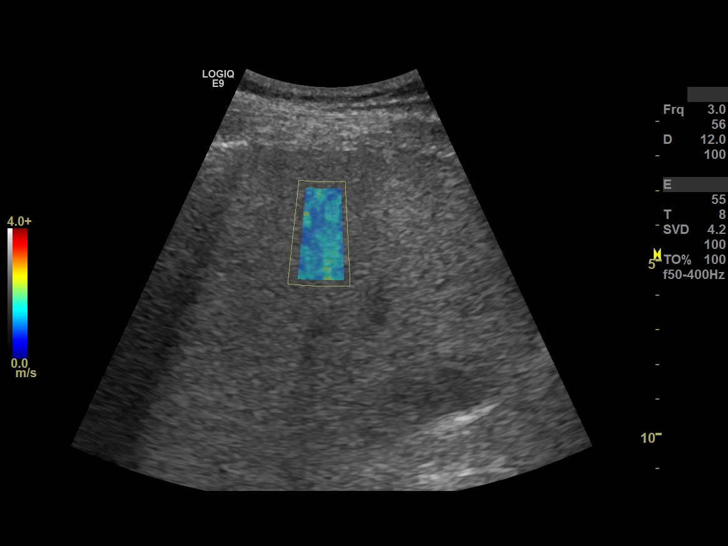
[im 9/10]
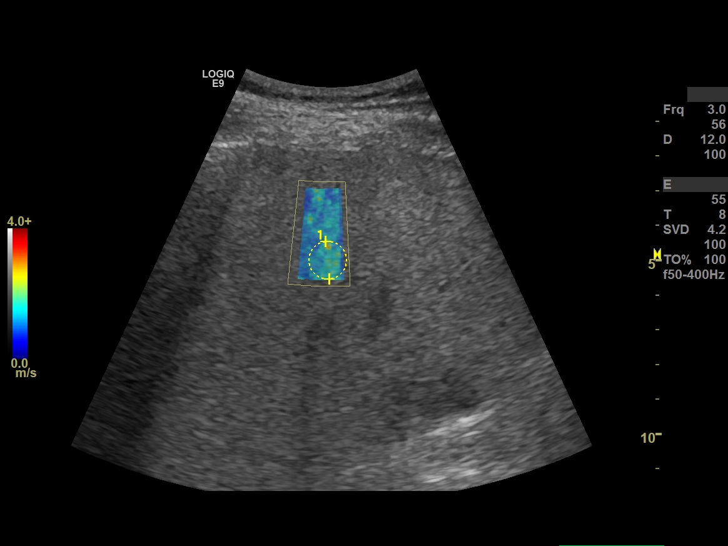
[im 10/10]
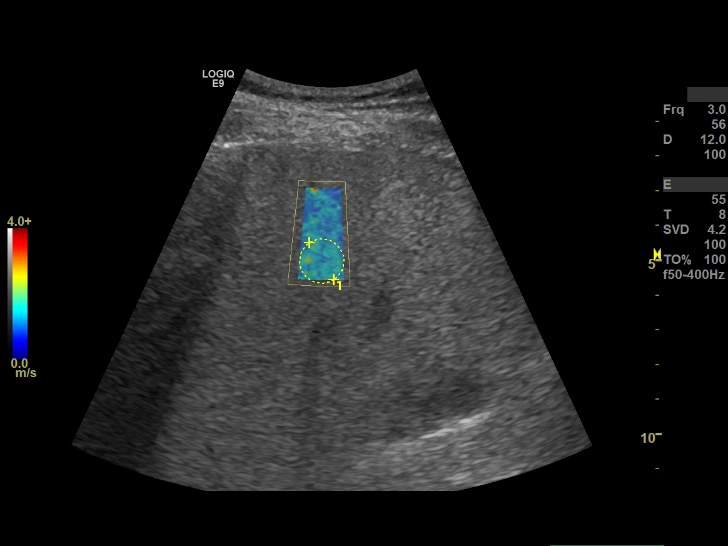

[10 of 10 positions shown; findings below may reference images not displayed]

FINDINGS: US ELASTOGRAPHY E9

10  shear wave measurements were acquired using a right intercostal approach with patient in a LLO position, with median of 1.46 m/s (range, 1.38 - 1.56 m/s). The IQR/median value is 10.3%. Measurements were obtained using a C1-6 transducer.
IMPRESSION: 1. METAVIR score is F1.

2. Median liver shear wave speed = 1.46 m/s.

GE E9 Shear Wave Elastography Reference Ranges

Liver Fibrosis Staging:        METAVIR Score                    m/s

Normal-Mild                        F1                              1.35-1.66 m/s

Mild-Moderate                    F2                              1.66-1.77 m/s

Moderate-Severe                 F3                              1.77-1.99 m/s

Cirrhosis                             F4                                     >1.99 m/s

Note, ultrasound shear wave measurements may also be affected by presence of hepatic congestion, steatosis and inflammation. Shear wave speed measurements should be interpreted in conjunction with other available clinical data, and they should not be considered interchangeable with MRI- derived stiffness measurements at this time.

## 2021-10-04 IMAGING — US US NON OB TRANSVAGINAL W LTD TA
1 series · 14 of 28 positions shown · non-contrast
Comparison: none

[Series 2: us non ob transvaginal w ltd ta · 14 of 46 slices shown]
[im 2/46]
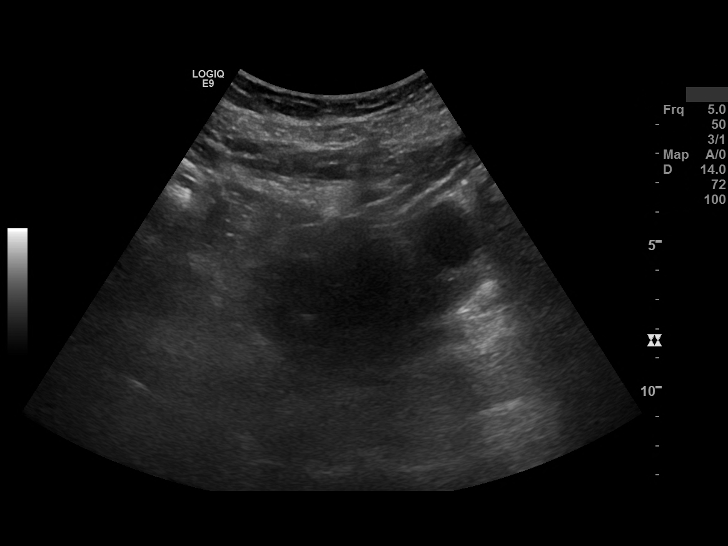
[im 6/46]
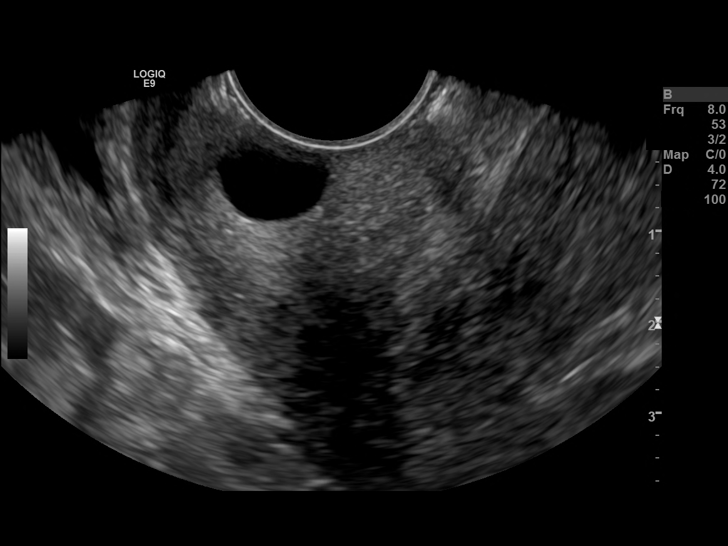
[im 9/46]
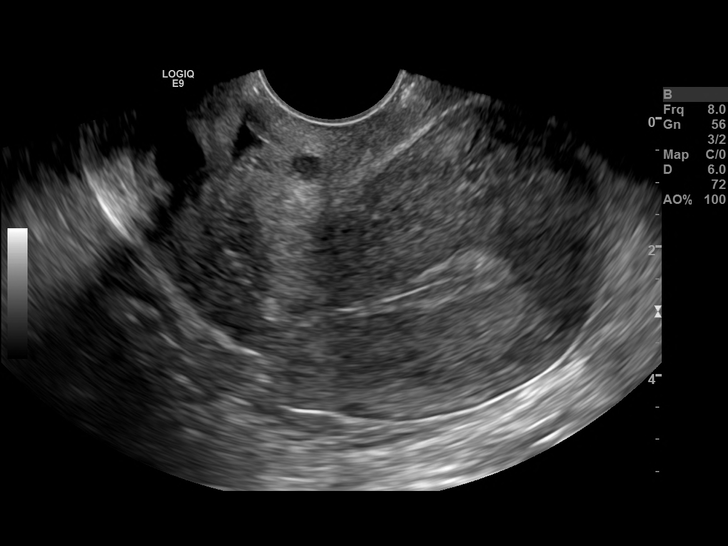
[im 12/46]
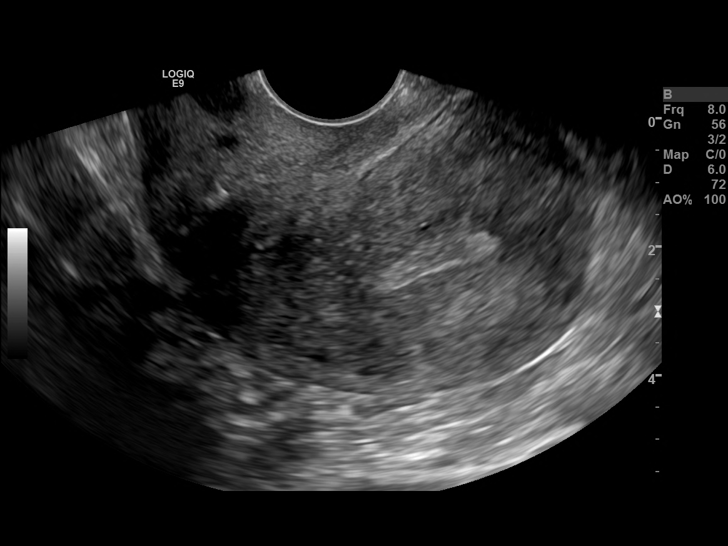
[im 16/46]
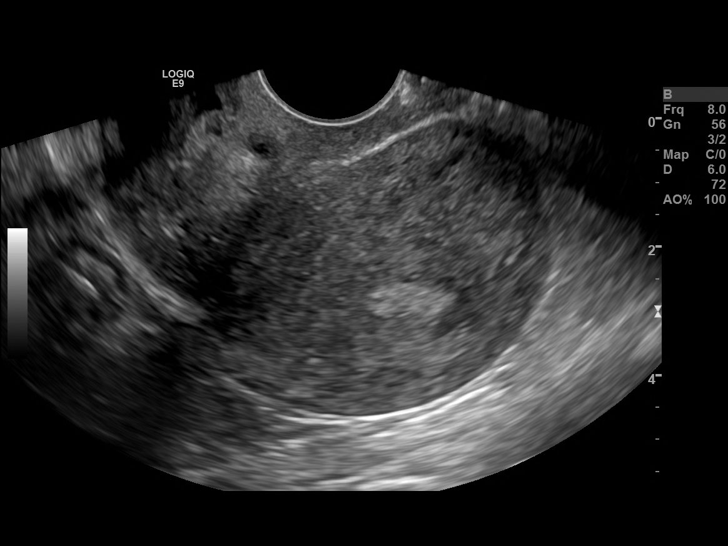
[im 19/46]
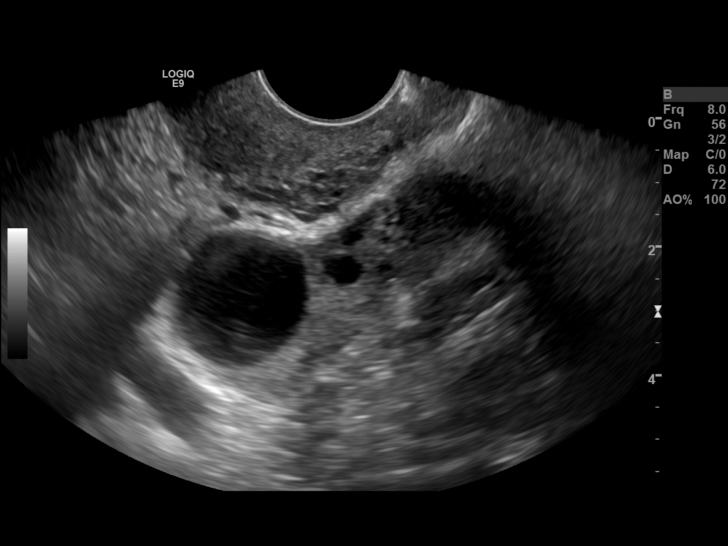
[im 22/46]
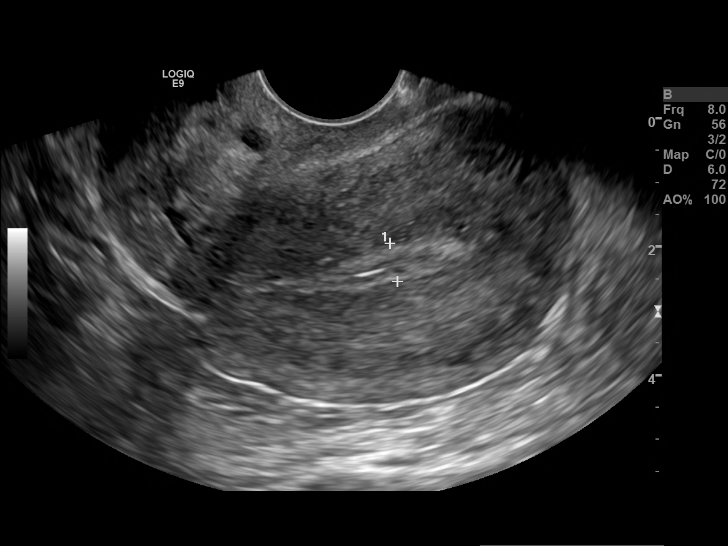
[im 26/46]
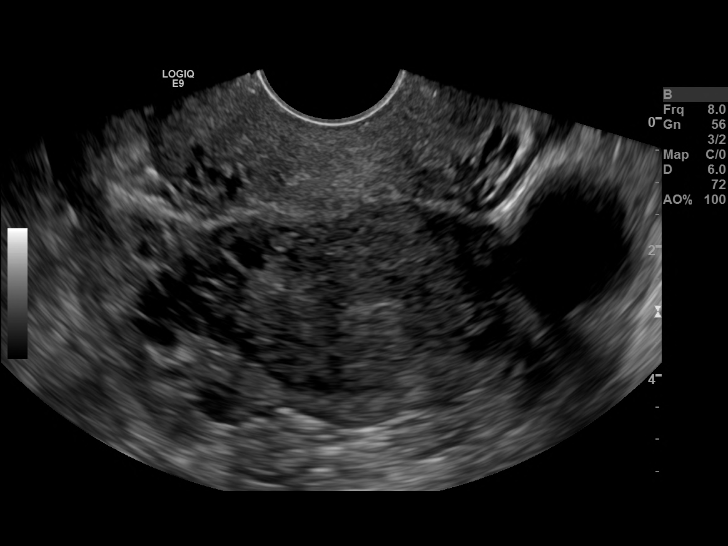
[im 29/46]
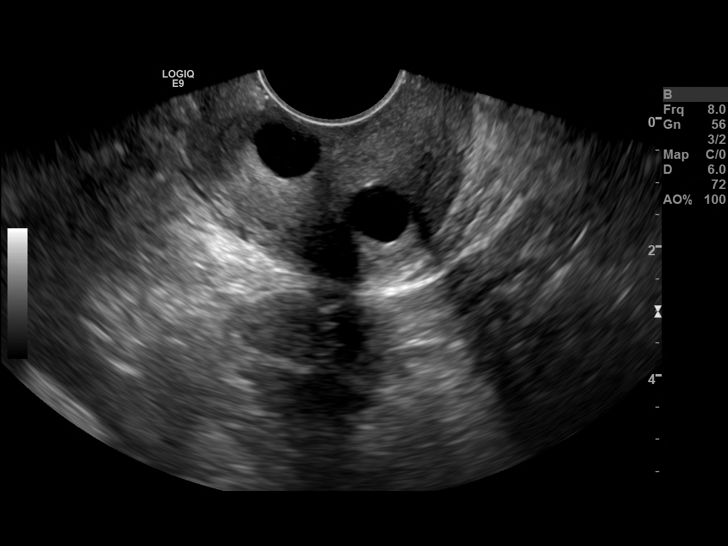
[im 32/46]
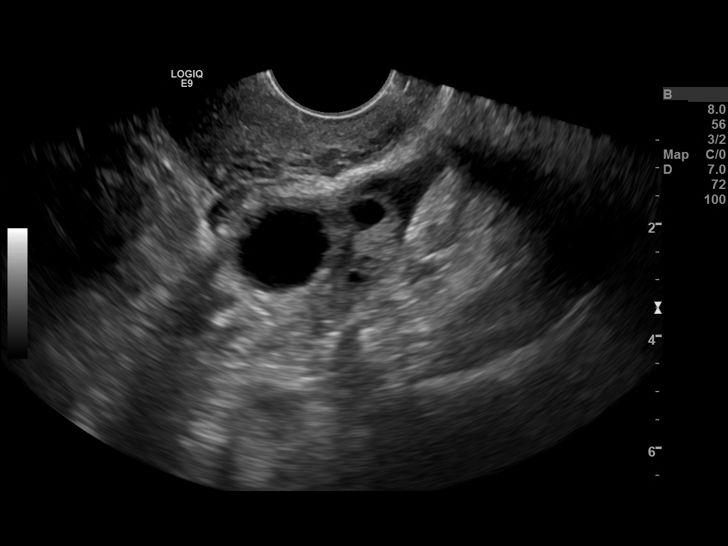
[im 36/46]
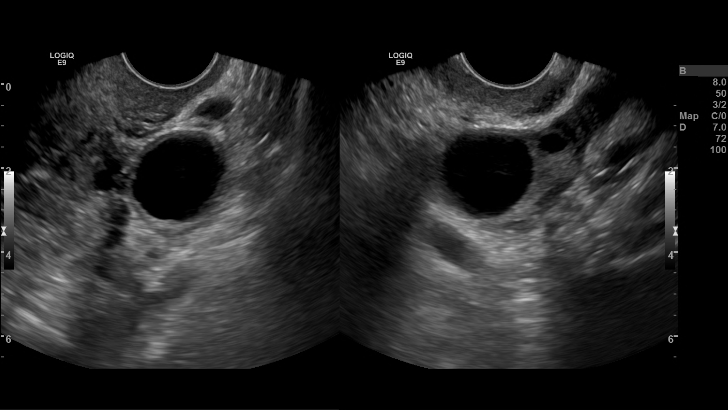
[im 39/46]
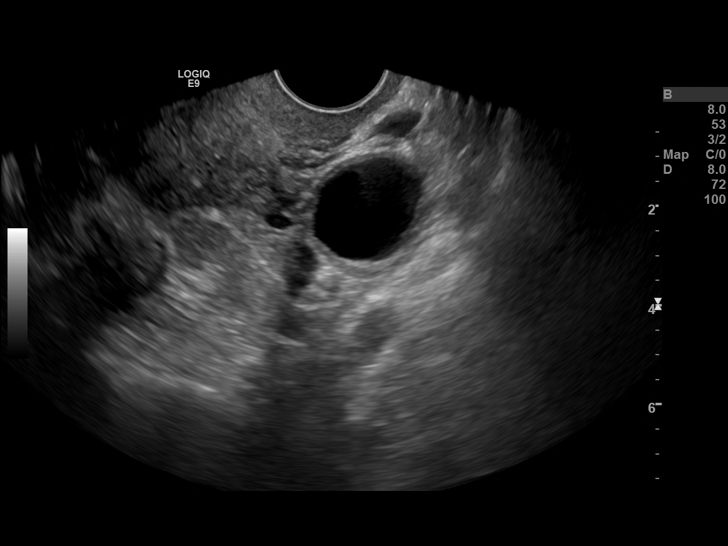
[im 42/46]
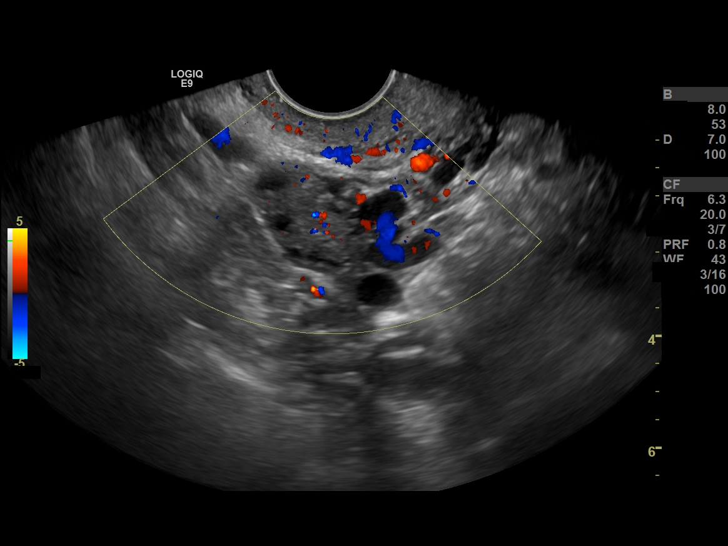
[im 46/46]
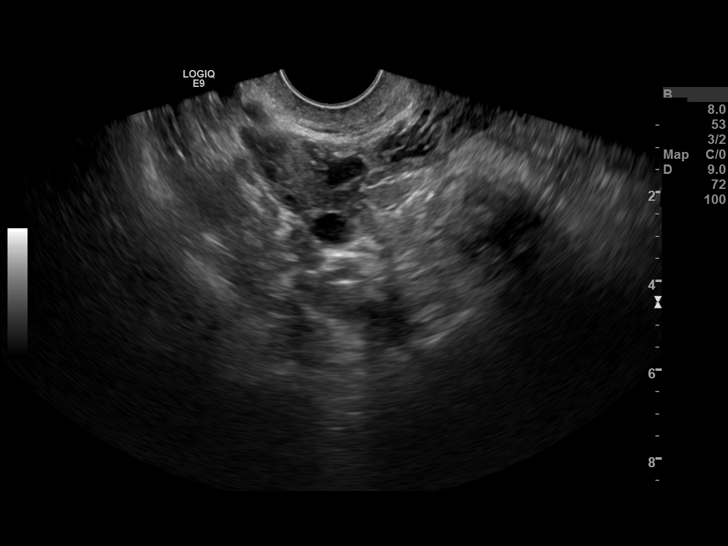

[14 of 28 positions shown; findings below may reference images not displayed]

REASON FOR EXAM

*
Abnormality of right ovary

*
Pelvic pain 

COMPARISON

*
None

TECHNIQUE

*
Multiple gray-scale and color flow images of the pelvis were obtained using both transabdominal and transvaginal approaches

FINDINGS

Uterus

*
45.5 x 96.79 x 60.26 mm

*
Retroverted

*
Endometrial stripe thickness of 6.1 mm

*
No myometrial mass

*
Multiple nabothian cysts

Right Ovary

*
15.3 x 34.1 x 18.6 mm

*
Normal follicles

Left Ovary

*
25.7 x 35.9 x 26.5 mm

*
22 mm dominant follicle

Additional Findings

*
None

IMPRESSION

*
No acute sonographic findings in the pelvis

*
Retroverted uterus

*
Normal, follicular right ovary

## 2021-10-04 IMAGING — MR MRI BRAIN WO CONTRAST
9 series · 48 of 48 positions shown · non-contrast
Comparison: None.

HISTORY: Headache.
TECHNIQUE: Multiplanar, multisequential MRI images of the brain are obtained without intravenous contrast.

[Series 1: bSSFP · axial · 8.0mm · 1.17mm/px · 1 of 19 slices shown]
[im 1/19]
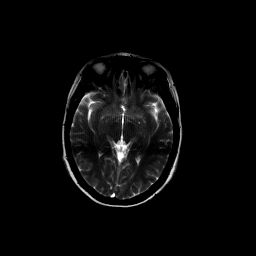

[Series 2: mprage_axial_pre · axial · 1.0mm · 0.90mm/px · z∈[-104,+54]mm · 12 of 160 slices shown]
[im 1/160]
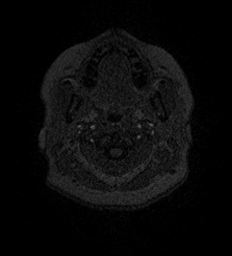
[im 15/160]
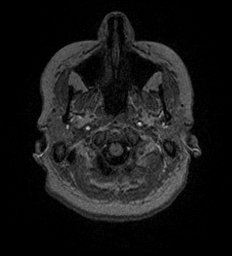
[im 29/160]
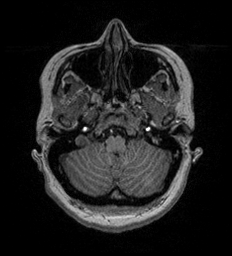
[im 44/160]
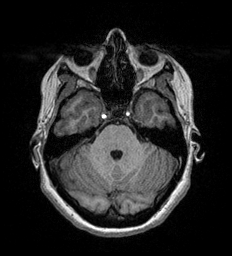
[im 58/160]
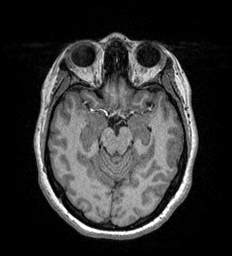
[im 73/160]
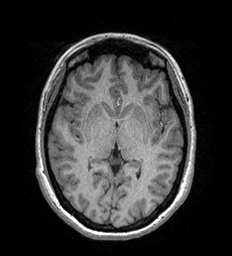
[im 87/160]
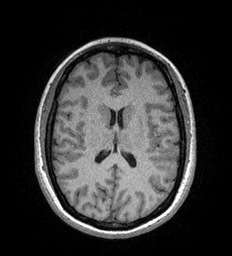
[im 102/160]
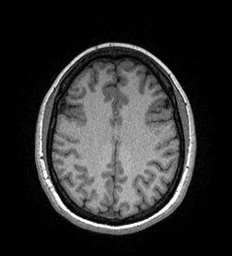
[im 116/160]
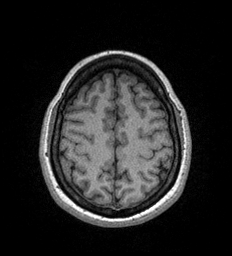
[im 131/160]
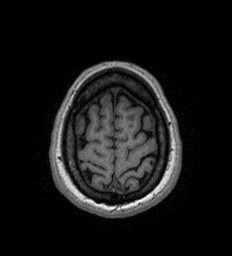
[im 145/160]
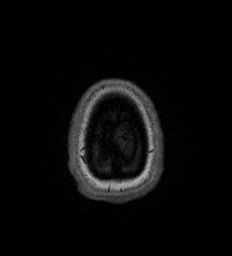
[im 160/160]
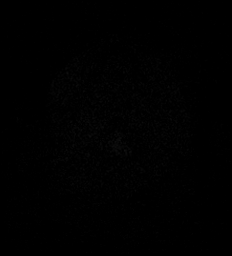

[Series 3: mprage_sag_recon · sagittal · 1.0mm · 0.82mm/px · 12 of 165 slices shown]
[im 1/165]
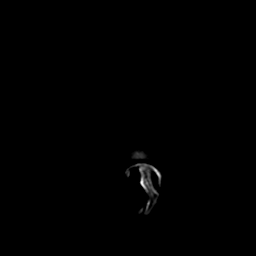
[im 15/165]
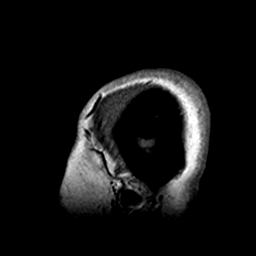
[im 30/165]
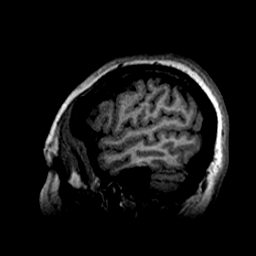
[im 45/165]
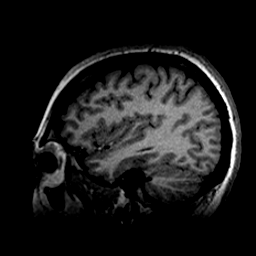
[im 60/165]
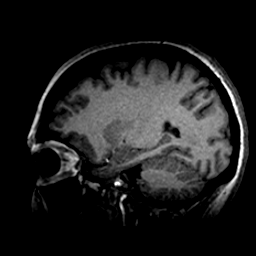
[im 75/165]
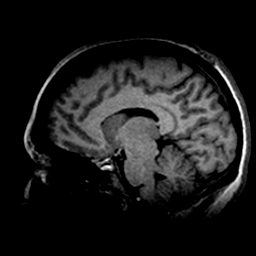
[im 90/165]
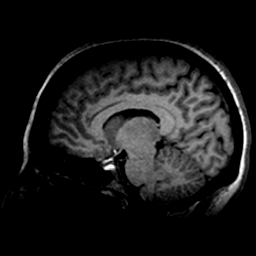
[im 105/165]
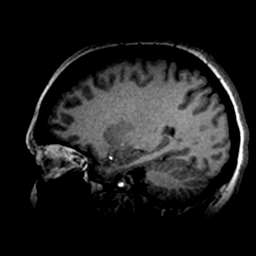
[im 120/165]
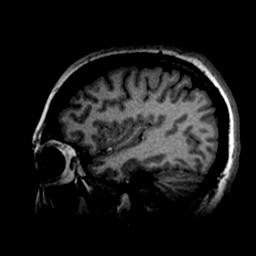
[im 135/165]
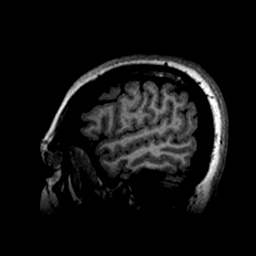
[im 150/165]
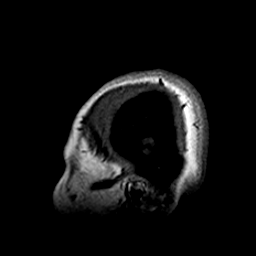
[im 165/165]
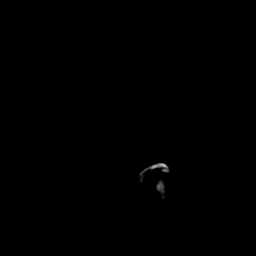

[Series 4: flair_axial_fs · axial · 5.0mm · 0.41mm/px · z∈[-107,+37]mm · 2 of 25 slices shown]
[im 1/25]
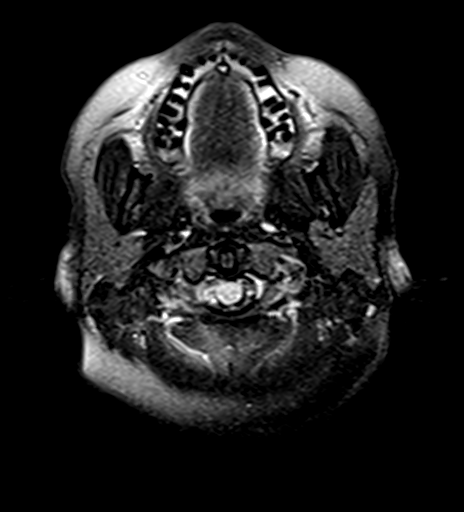
[im 25/25]
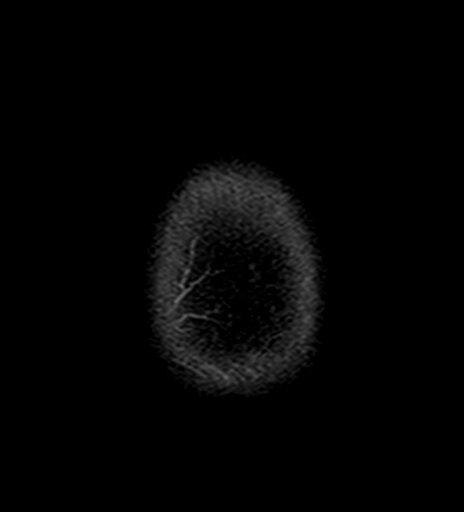

[Series 5: mprage_cor_recon · coronal · 1.0mm · 0.71mm/px · 13 of 171 slices shown]
[im 1/171]
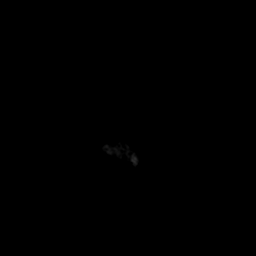
[im 15/171]
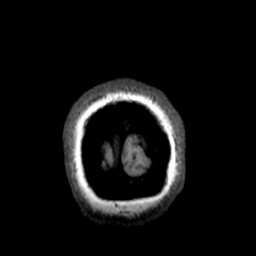
[im 29/171]
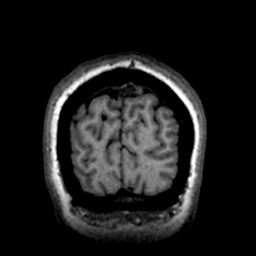
[im 43/171]
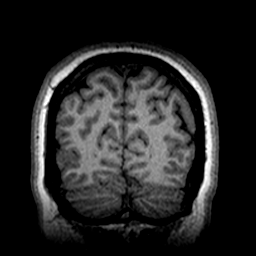
[im 57/171]
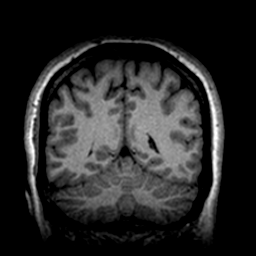
[im 71/171]
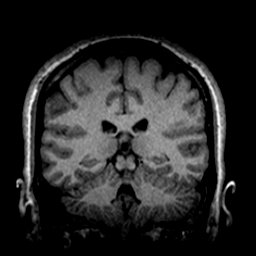
[im 86/171]
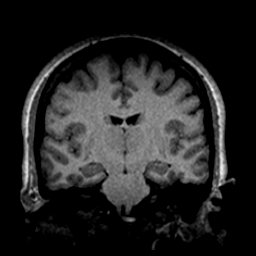
[im 100/171]
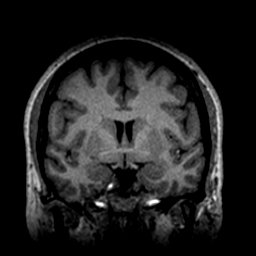
[im 114/171]
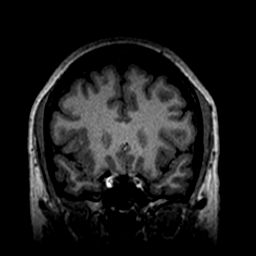
[im 128/171]
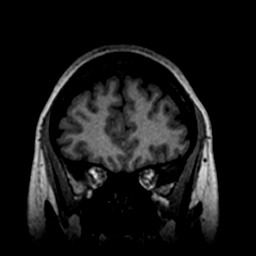
[im 142/171]
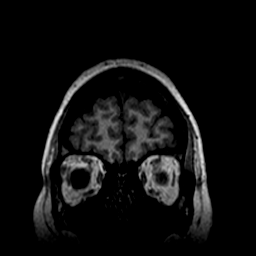
[im 156/171]
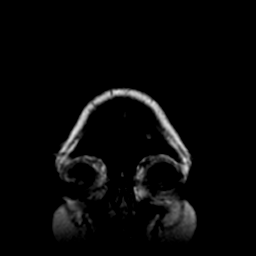
[im 171/171]
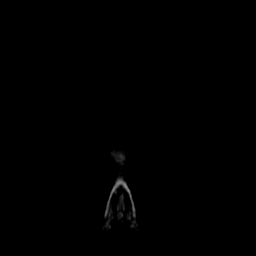

[Series 6: t2_axial · axial · 5.0mm · 0.66mm/px · z∈[-107,+37]mm · 2 of 25 slices shown]
[im 1/25]
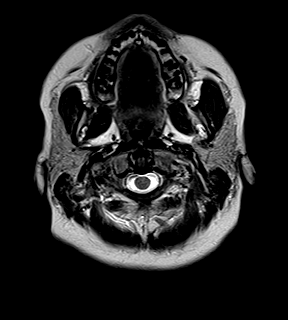
[im 25/25]
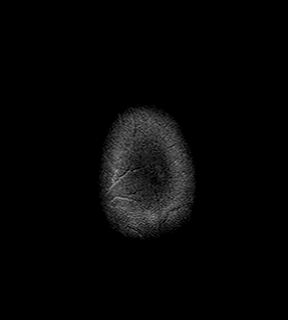

[Series 7: DWI · axial · 5.0mm · 1.23mm/px · z∈[-107,+37]mm · 2 of 25 slices shown (1 of 2)]
[im 1/25]
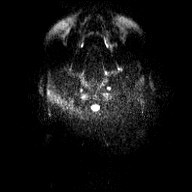
[im 25/25]
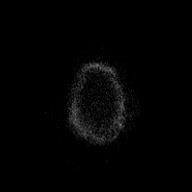

[Series 8: DWI · axial · 5.0mm · 1.23mm/px · z∈[-107,+37]mm · 2 of 25 slices shown (2 of 2)]
[im 1/25]
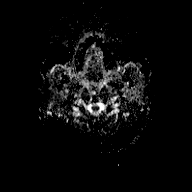
[im 25/25]
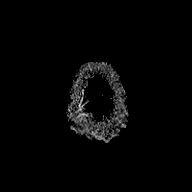

[Series 9: flash_axial · axial · 5.0mm · 0.41mm/px · z∈[-107,+37]mm · 2 of 25 slices shown]
[im 1/25]
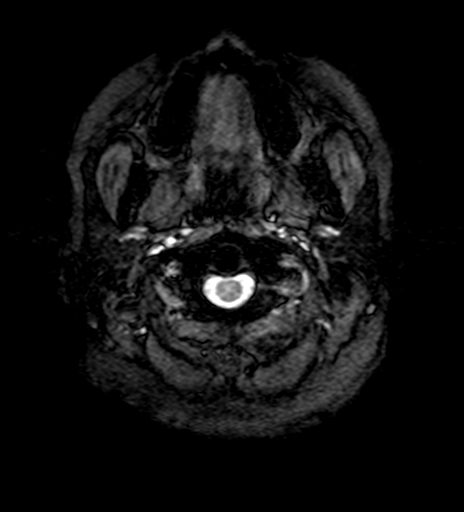
[im 25/25]
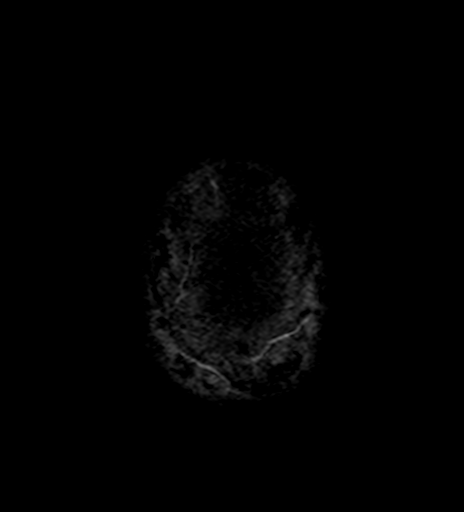

[48 of 48 positions shown; findings below may reference images not displayed]

FINDINGS: The ventricles and sulci are normal in size and symmetric.

The basal cisterns are patent.

There is no mass effect or midline shift.

There is no restricted diffusion. 

The gray/white matter differentiation is preserved.

There are no intra- or extra-axial fluid collections identified.

Paranasal sinuses demonstrate fluid within the sphenoid sinuses consistent with acute sinusitis. Remaining paranasal sinuses are clear. Mastoid air cells are clear.

The internal auditory canals and orbits are unremarkable.
IMPRESSION: 1.
Normal MRI of the brain.

2.
Findings of acute sphenoid sinusitis.

IMPORTANT FINDINGS!

CODE fax

## 2022-04-27 IMAGING — US US BREAST RT LTD
1 series · 9 of 9 positions shown · non-contrast
Comparison: Multiple prior exams most recently 09/13/2021

Images Obtained from [facility]
INDICATION: Right axilla lump.
TECHNIQUE: Bilateral 2-D digital diagnostic mammogram was performed followed by 3-D tomosynthesis. Current study was also evaluated with a computer aided detection (CAD) system. Targeted right
breast ultrasound was also performed.

[Series 1: us breast right ltd · 9 of 9 slices shown]
[im 1/9]
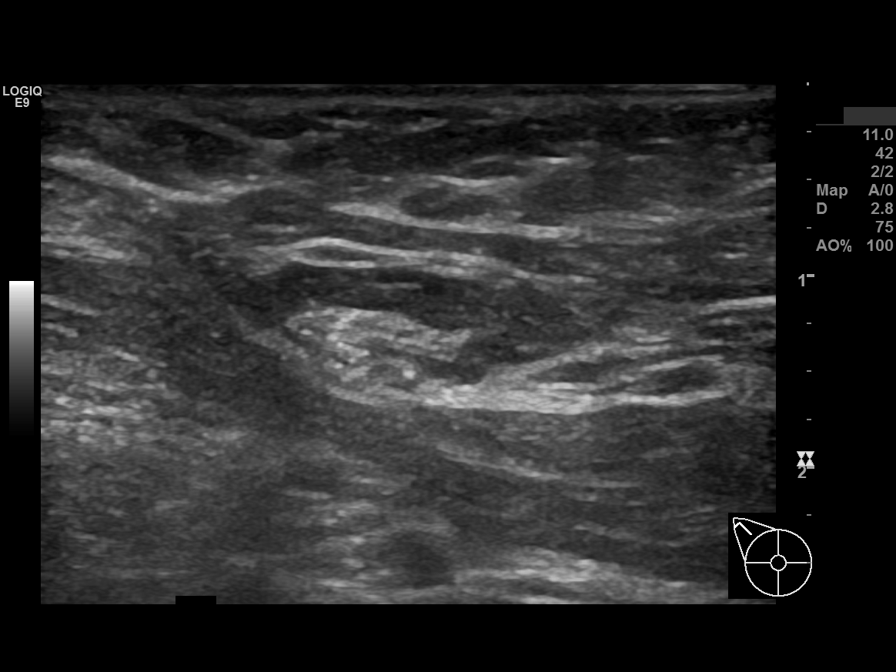
[im 2/9]
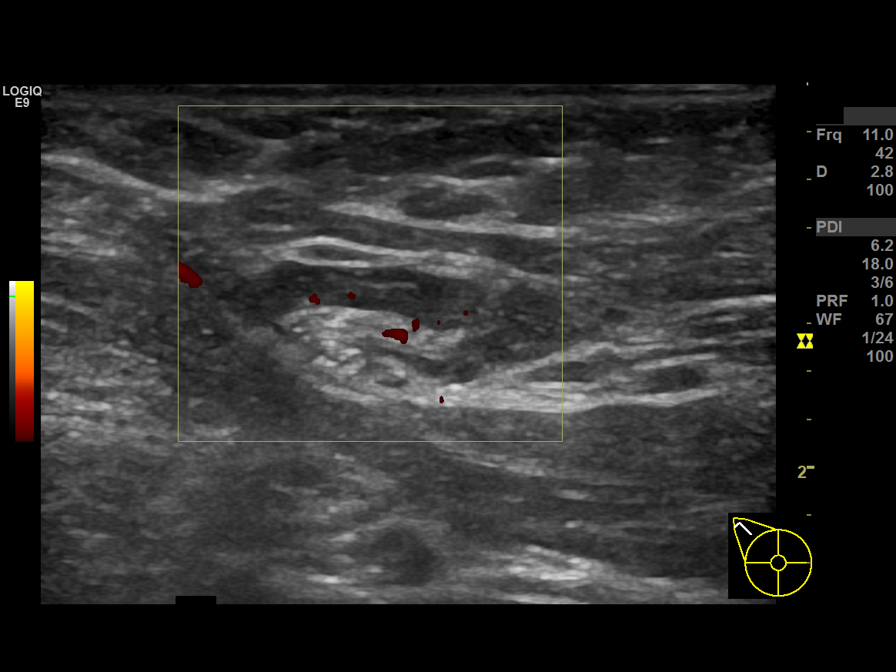
[im 3/9]
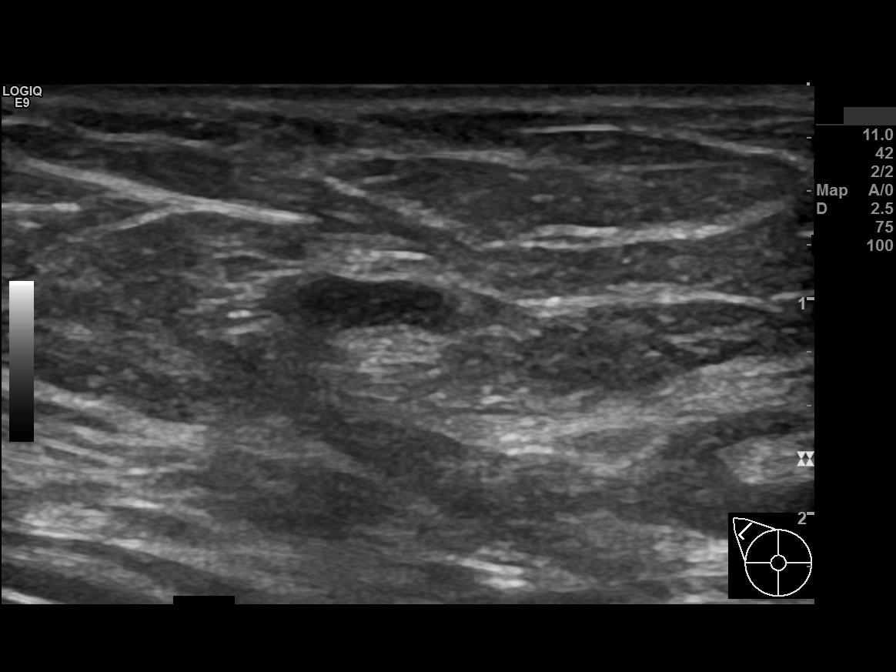
[im 4/9]
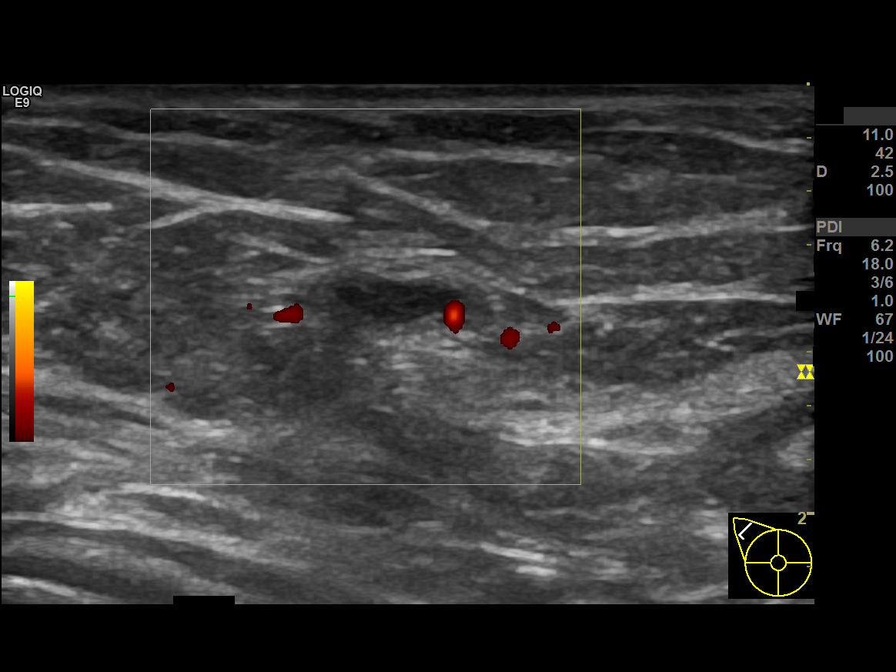
[im 5/9]
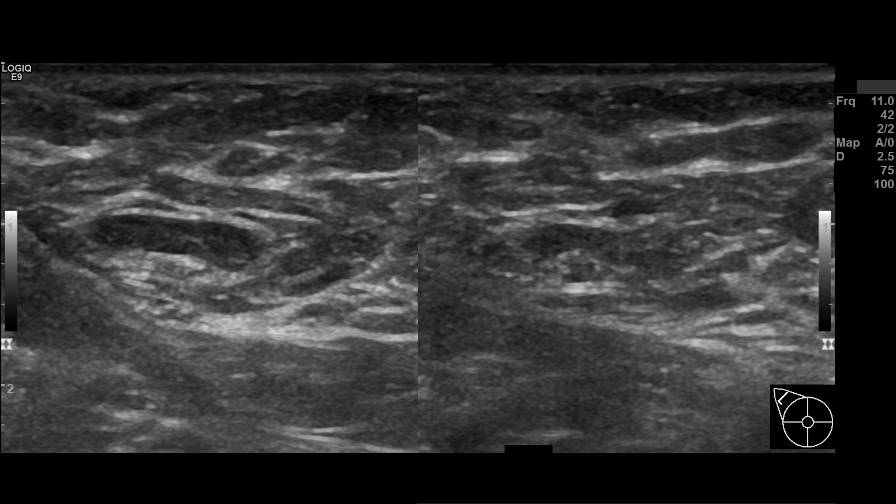
[im 6/9]
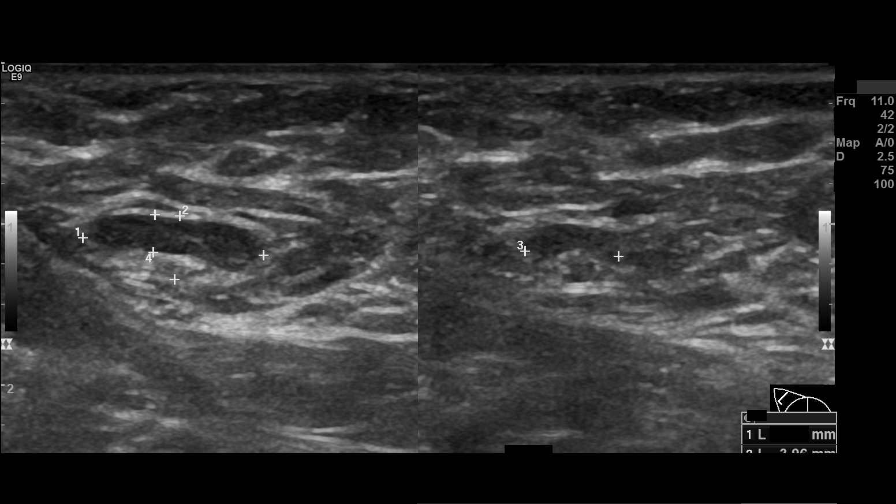
[im 7/9]
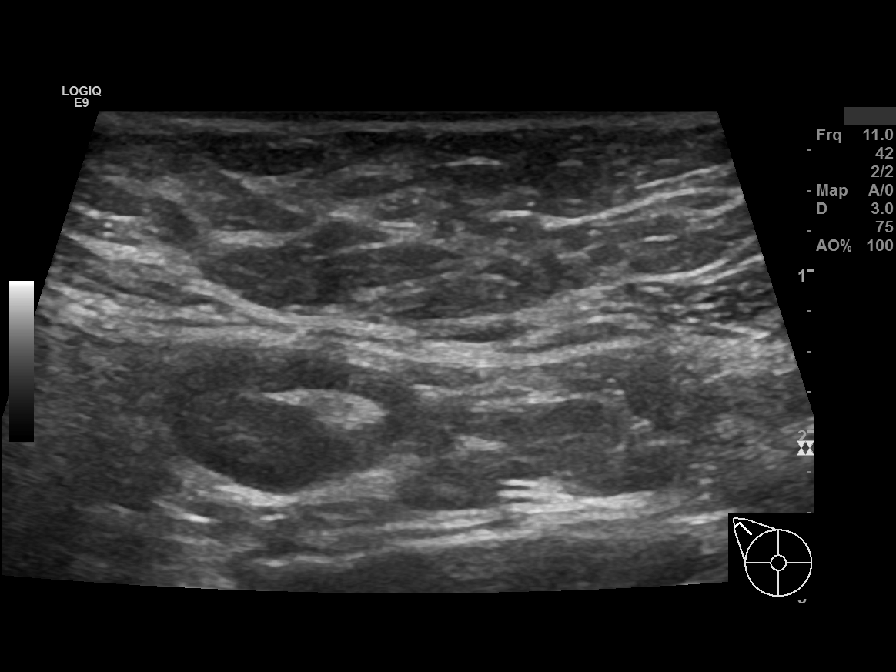
[im 8/9]
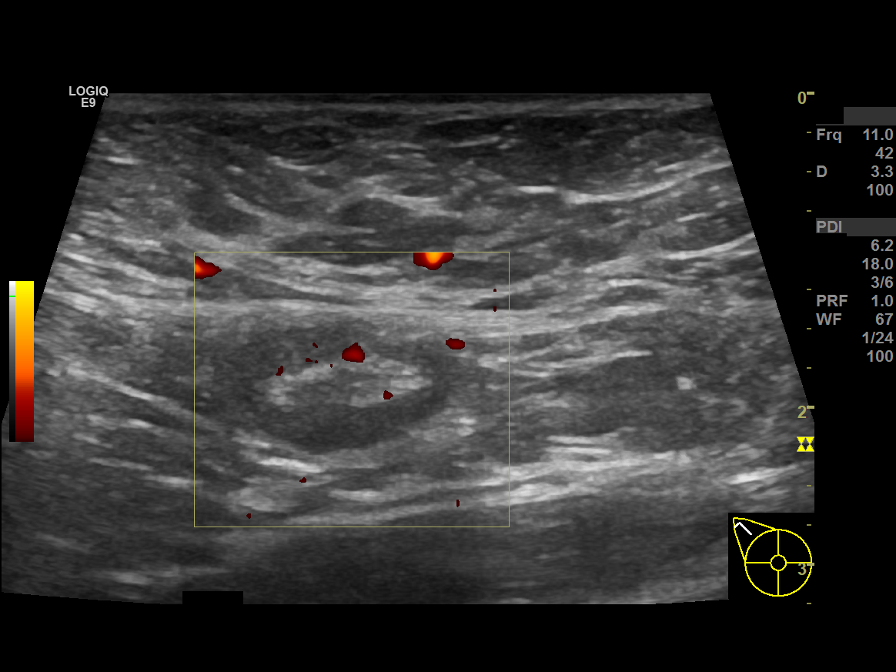
[im 9/9]
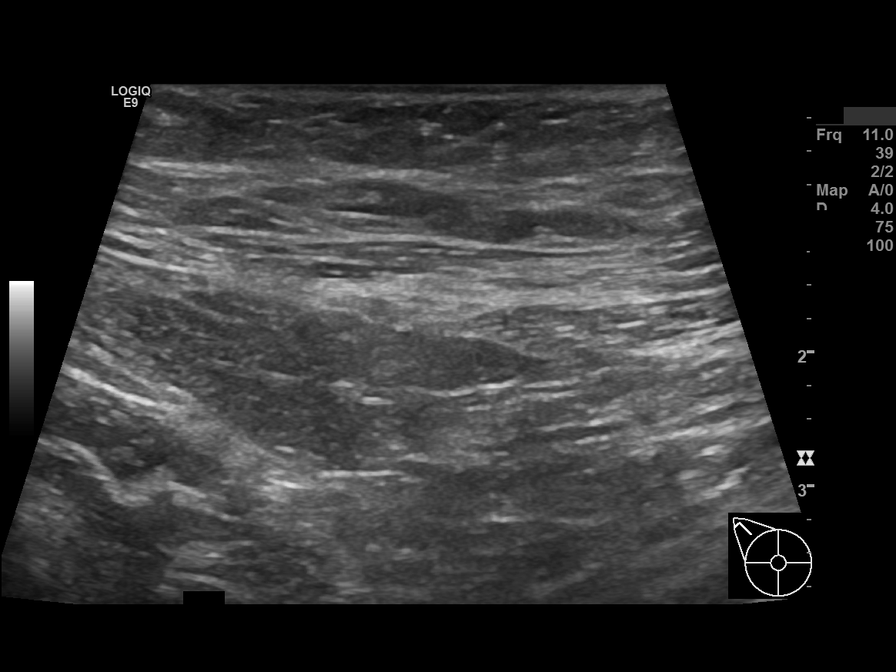

[9 of 9 positions shown; findings below may reference images not displayed]

FINDINGS: BILATERAL DIAGNOSTIC MAMMOGRAM:  There are scattered areas of fibroglandular density. No significant interval change. No suspicious findings.
TARGETED RIGHT BREAST ULTRASOUND: No sonographic abnormality was seen in the right axilla. Right axillary lymph nodes are normal in size and morphology. No suspicious findings.
IMPRESSION: There is no mammographic or targeted sonographic evidence of malignancy. A screening mammogram is recommended at age 40.
No mammographic or sonographic abnormality in the area of concern in the right axilla. Right axillary lymph nodes are normal in appearance. Clinical follow-up recommended.
The patient received a copy of the results at the end of the examination.
FINAL ASSESSMENT: BI-RADS: Category 1 Negative

## 2022-04-27 IMAGING — MG MAMMO DIAG BIL W/CAD TOMO
6 of 10 series · 6 of 30 positions shown · non-contrast
Comparison: Multiple prior exams most recently 09/13/2021

Images Obtained from [facility]
INDICATION: Right axilla lump.
TECHNIQUE: Bilateral 2-D digital diagnostic mammogram was performed followed by 3-D tomosynthesis. Current study was also evaluated with a computer aided detection (CAD) system. Targeted right
breast ultrasound was also performed.

[R CC]
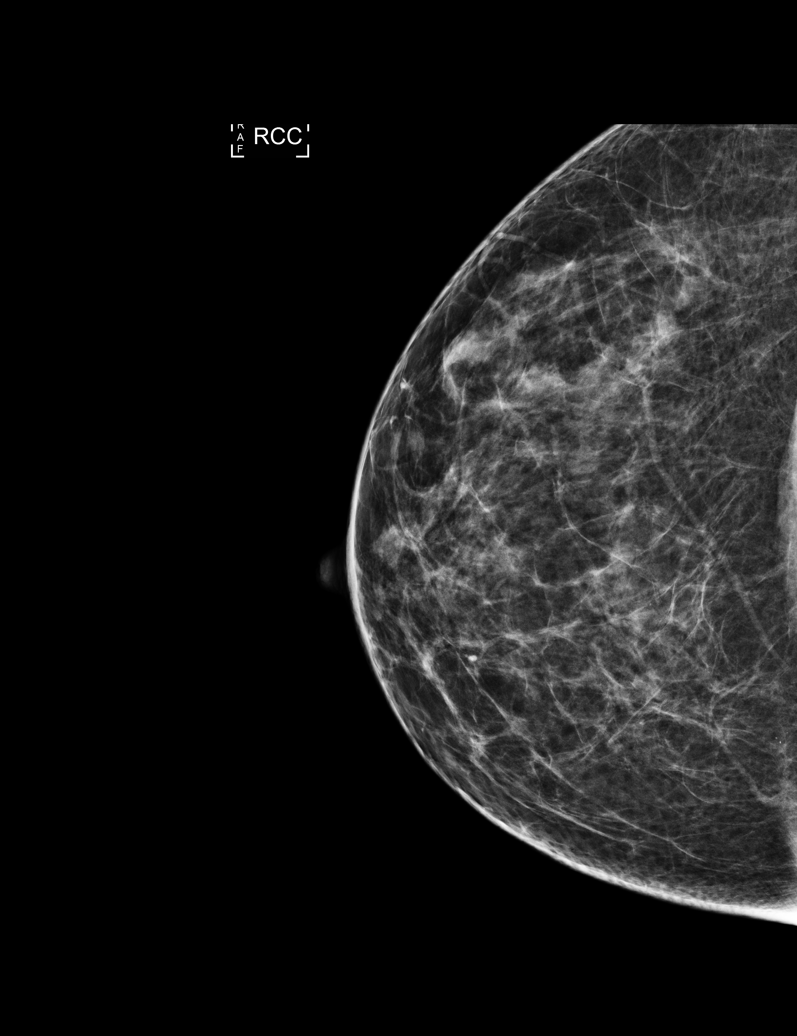

[L MLO]
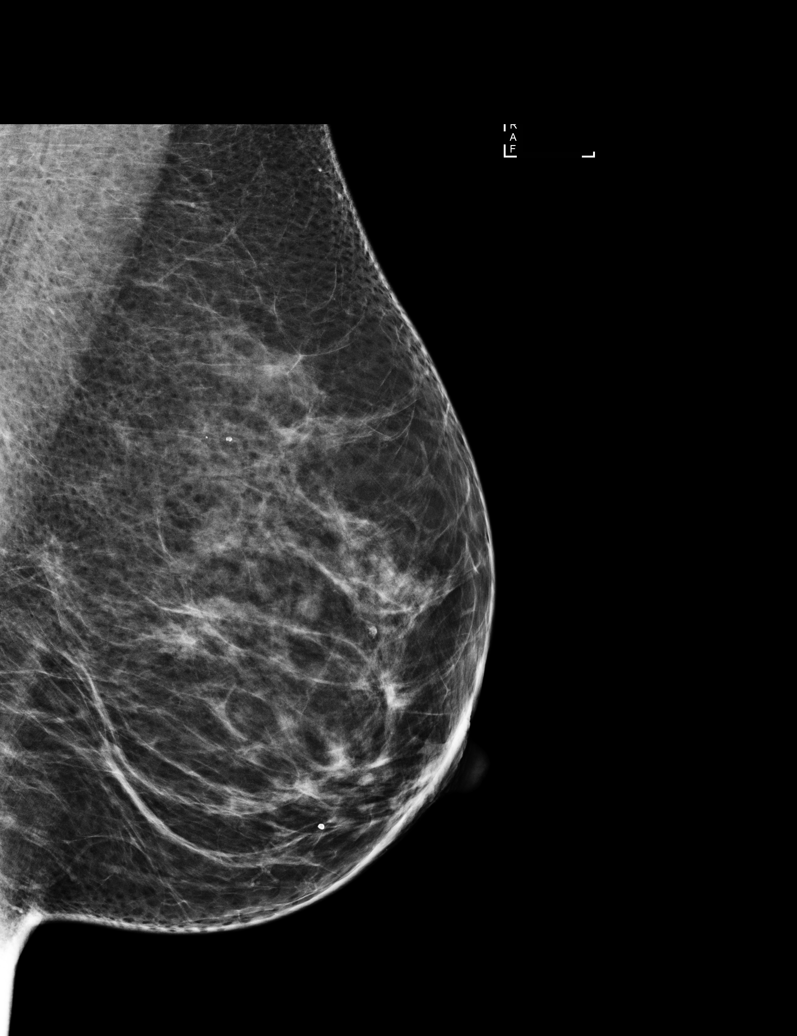

[R AT]
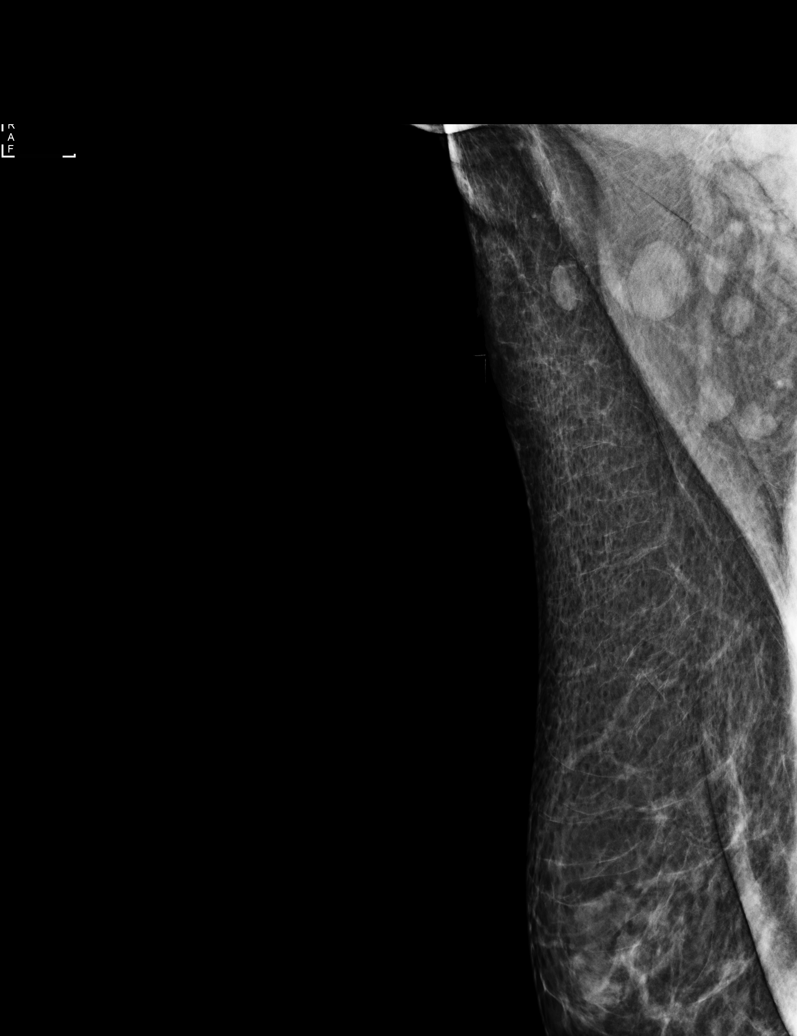

[L CC]
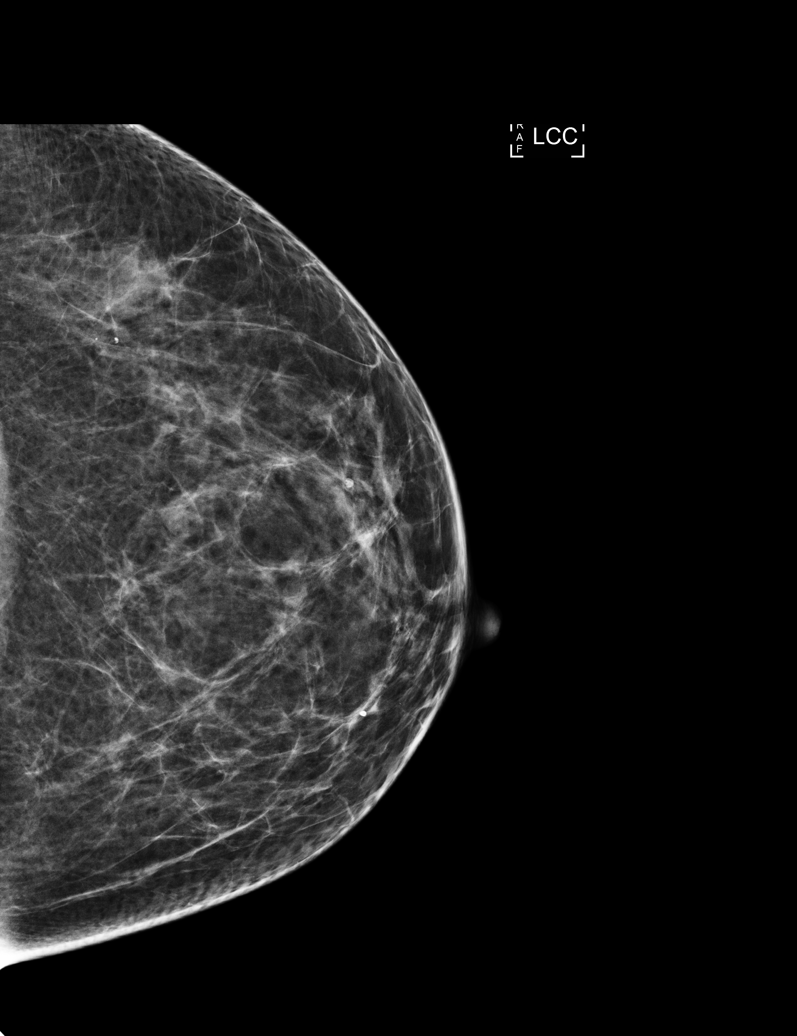

[R MLO]
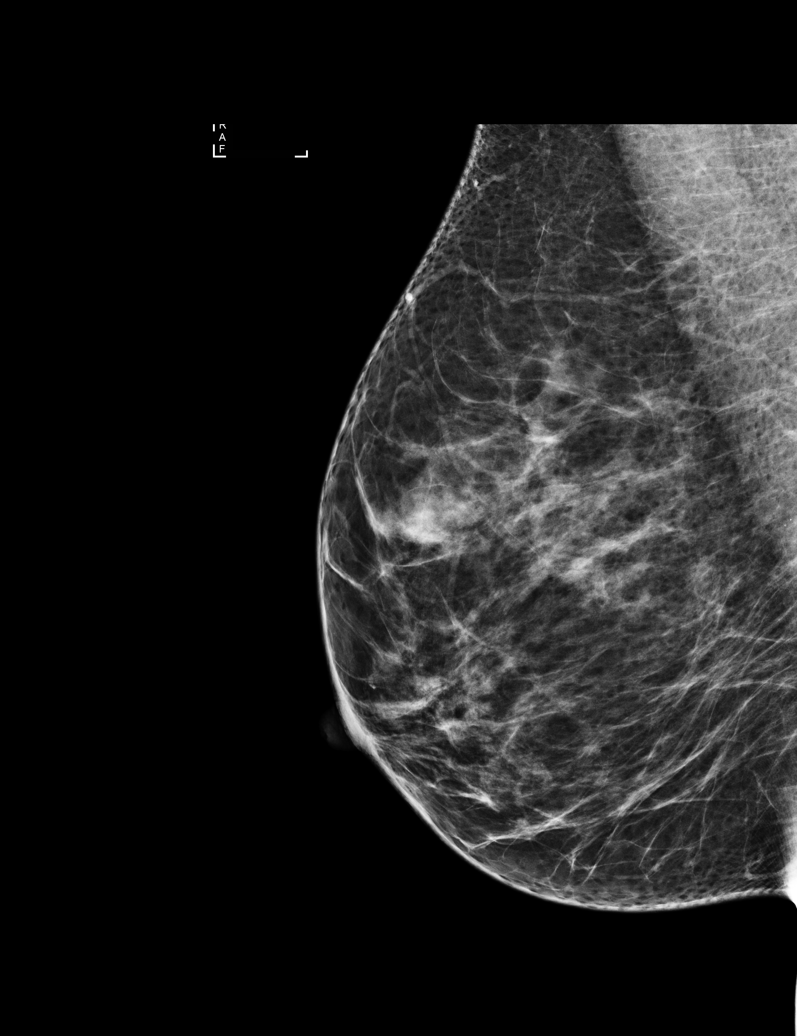

[R MLO tomo · tomo slice 33/66.0]
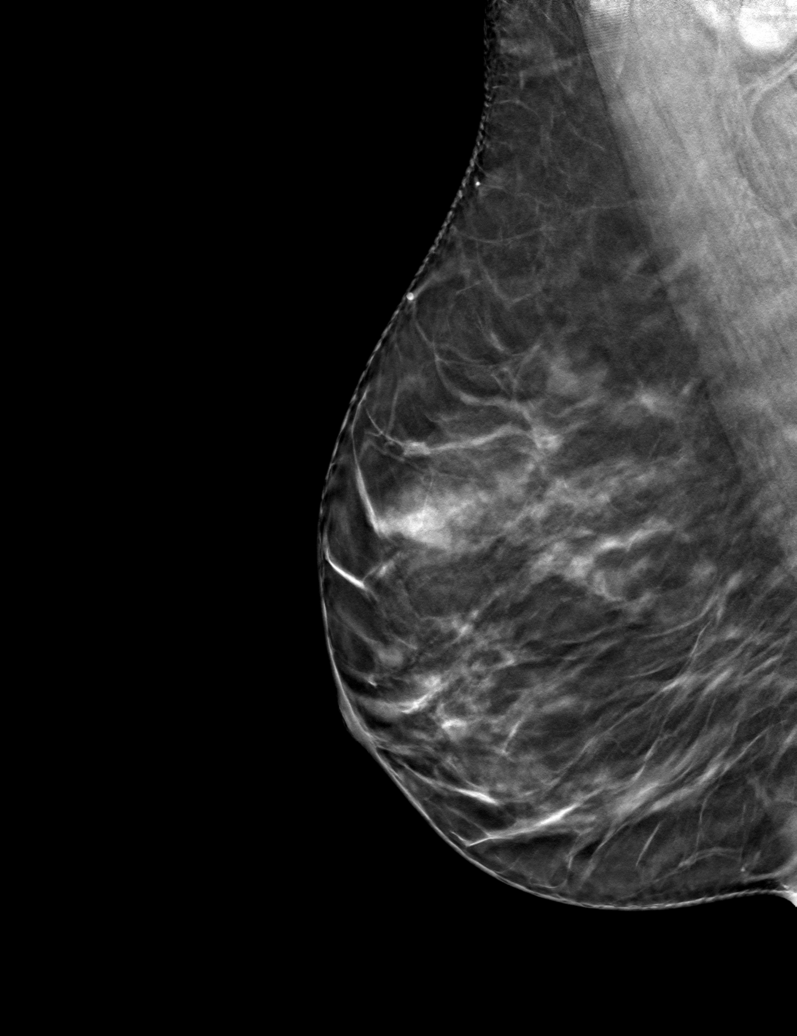

[6 of 30 positions shown; findings below may reference images not displayed]

FINDINGS: BILATERAL DIAGNOSTIC MAMMOGRAM:  There are scattered areas of fibroglandular density. No significant interval change. No suspicious findings.
TARGETED RIGHT BREAST ULTRASOUND: No sonographic abnormality was seen in the right axilla. Right axillary lymph nodes are normal in size and morphology. No suspicious findings.
IMPRESSION: There is no mammographic or targeted sonographic evidence of malignancy. A screening mammogram is recommended at age 40.
No mammographic or sonographic abnormality in the area of concern in the right axilla. Right axillary lymph nodes are normal in appearance. Clinical follow-up recommended.
The patient received a copy of the results at the end of the examination.
FINAL ASSESSMENT: BI-RADS: Category 1 Negative

## 2022-07-28 IMAGING — MR MRI LSPINE WO CONTRAST
4 of 5 series · 16 of 48 positions shown · non-contrast
Comparison: None.

Images Obtained from [facility]
INDICATION: Other intervertebral disc degeneration, lumbar region.
TECHNIQUE: Multiplanar, multiecho MR imaging of the lumbar spine was performed, including T1-weighted and fluid sensitive sequences without intravenous contrast.

[Series 16: t2_sag · sagittal · 4.0mm · 0.45mm/px · 7 of 16 slices shown]
[im 1/16]
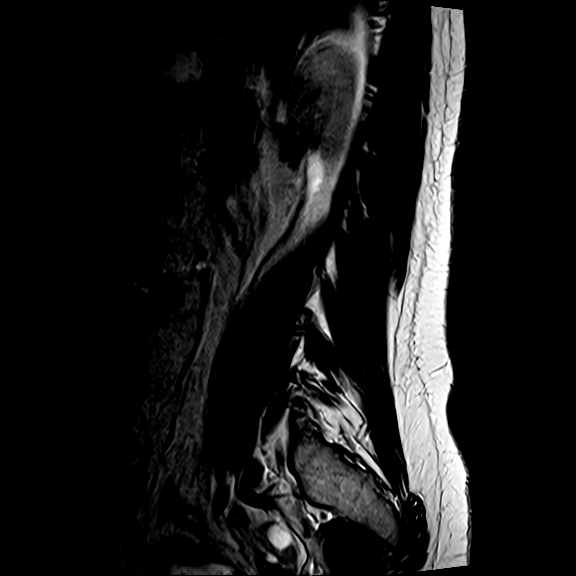
[im 3/16]
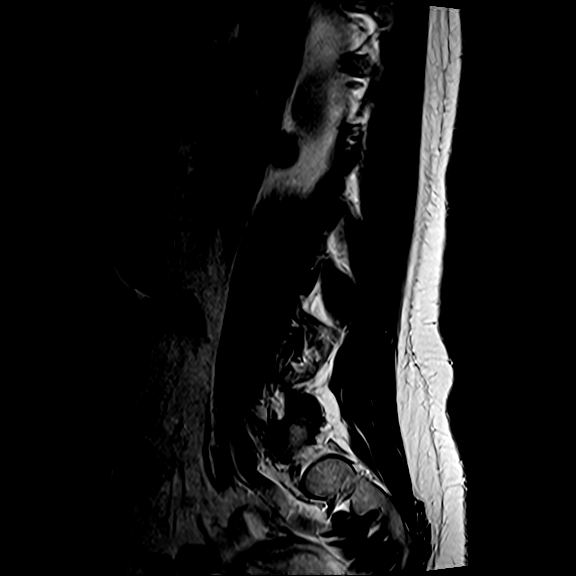
[im 6/16]
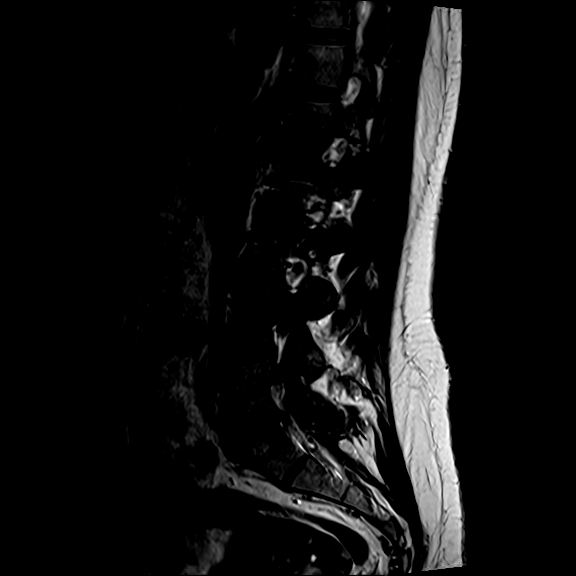
[im 8/16]
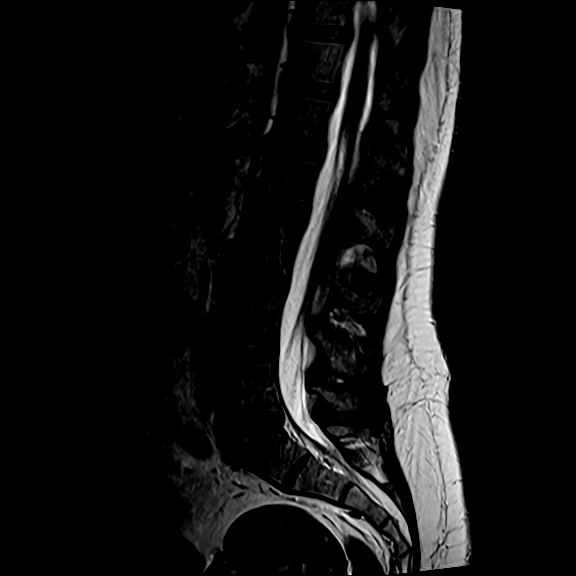
[im 11/16]
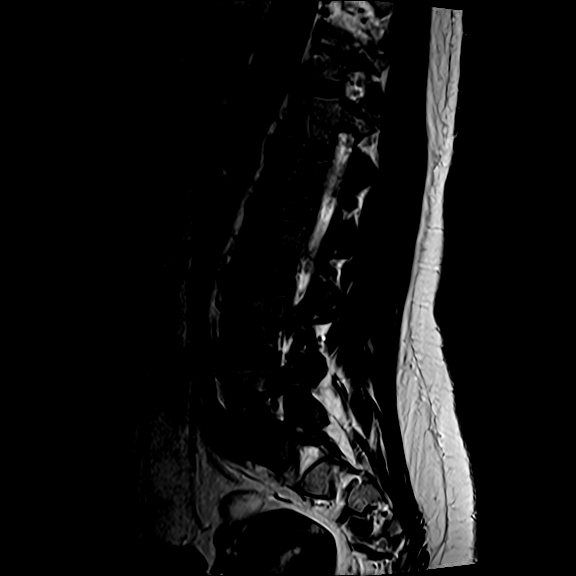
[im 13/16]
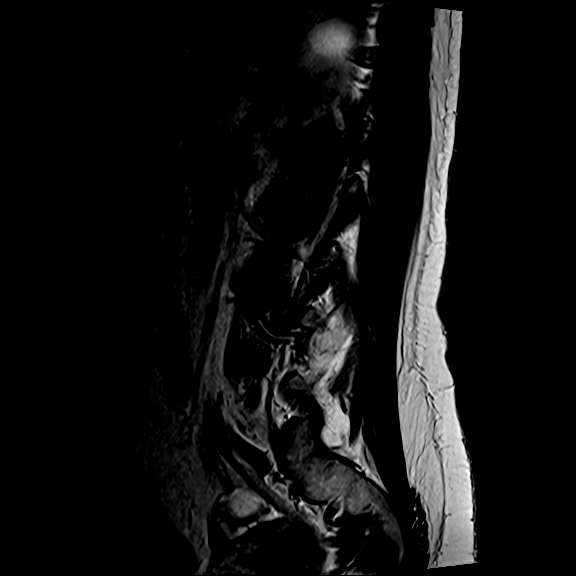
[im 16/16]
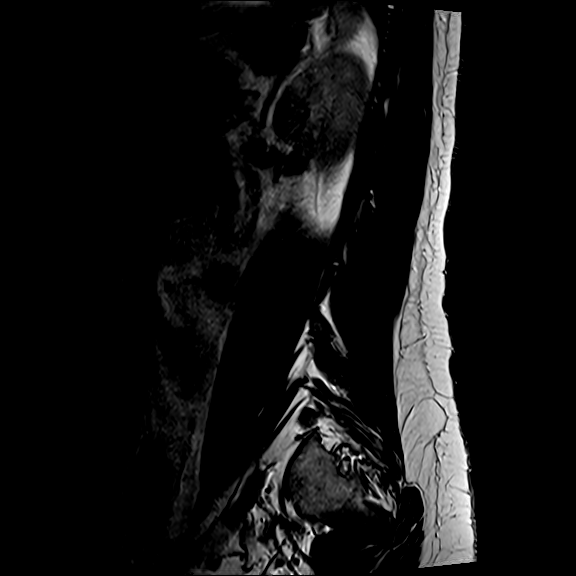

[Series 17: t1_sag · sagittal · 4.0mm · 0.45mm/px · 3 of 16 slices shown]
[im 3/16]
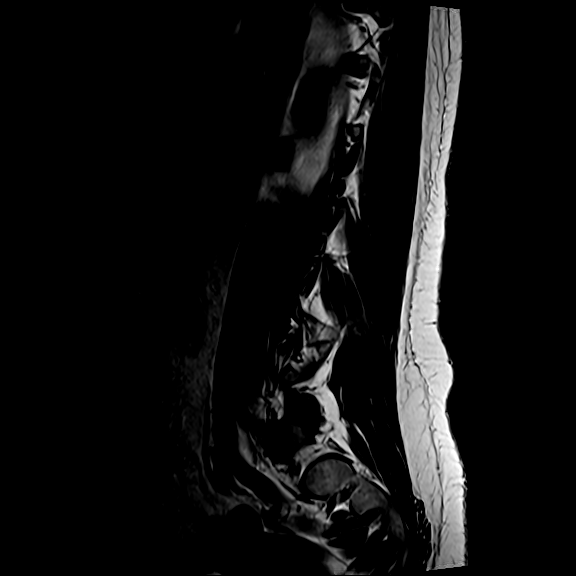
[im 8/16]
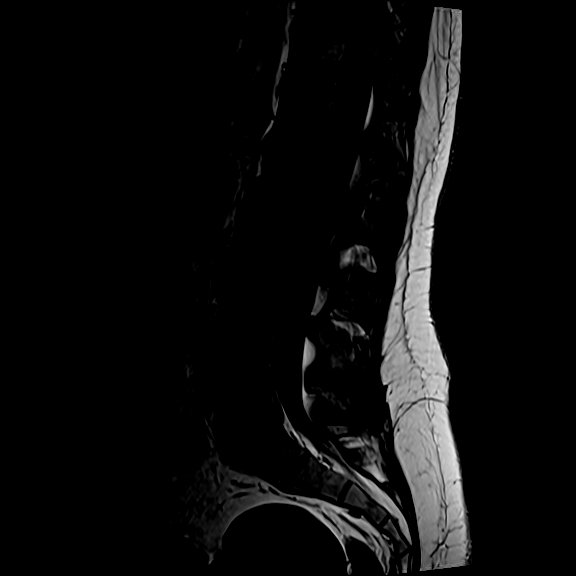
[im 13/16]
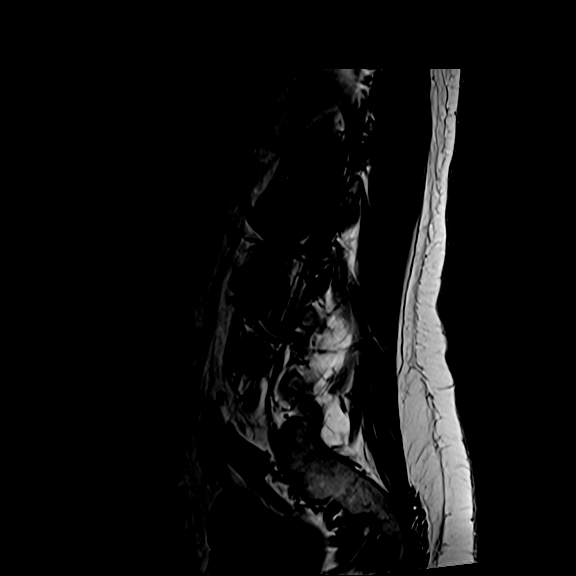

[Series 19: ir_sag · sagittal · 4.0mm · 0.51mm/px · 3 of 16 slices shown]
[im 3/16]
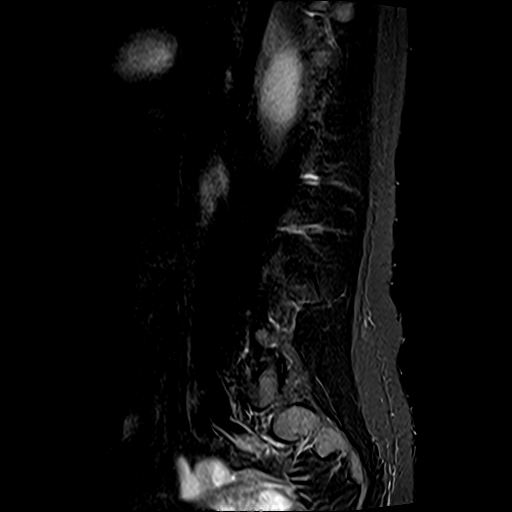
[im 8/16]
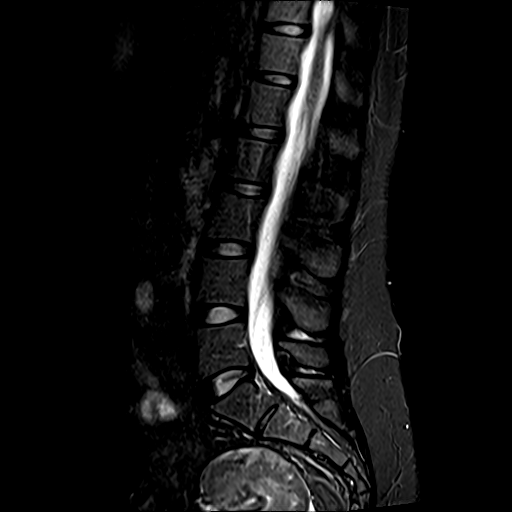
[im 13/16]
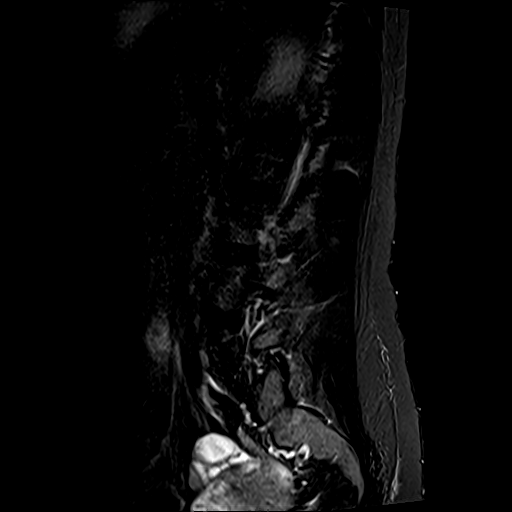

[Series 20: t2_axial · axial · 4.0mm · 0.33mm/px · z∈[-414,-299]mm · 3 of 33 slices shown]
[im 5/33]
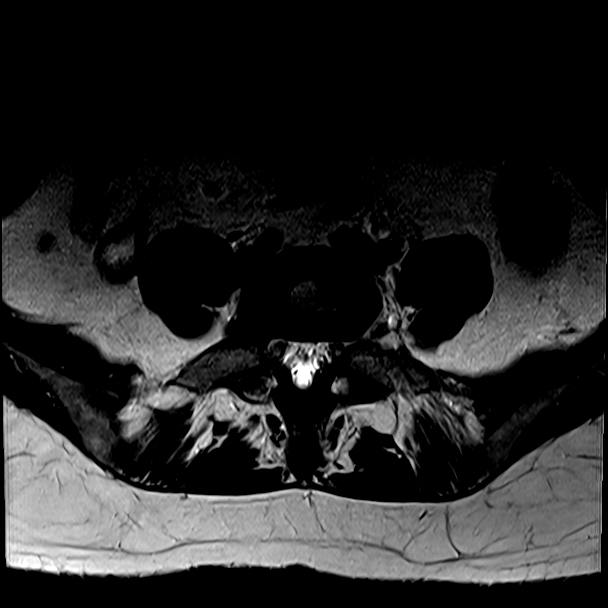
[im 17/33]
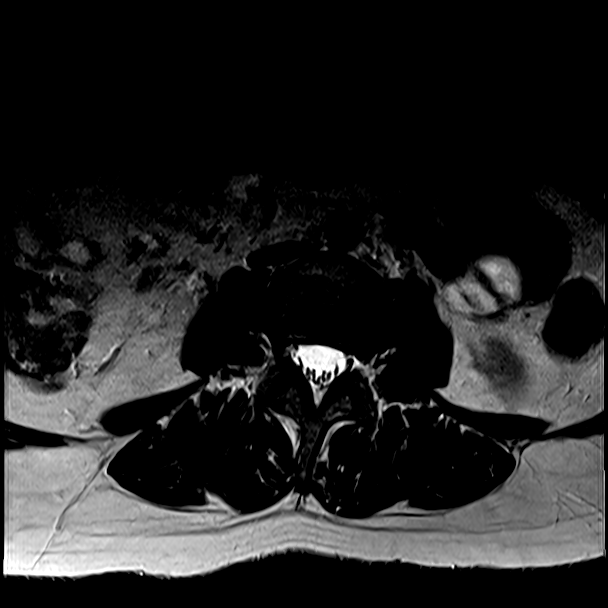
[im 28/33]
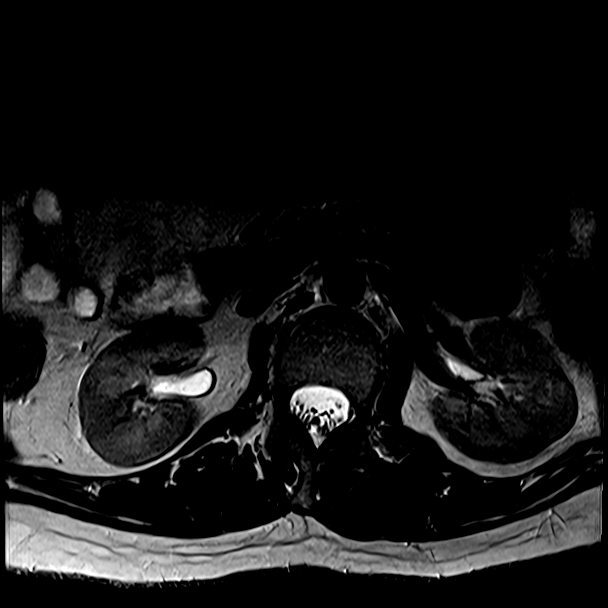

[16 of 48 positions shown; findings below may reference images not displayed]

FINDINGS: Conus: The conus is normal in appearance and position.
Alignment: Mild lumbar levoscoliosis. Transitional lumbar anatomy with a sacralized L5. Rudimentary disc space at L5-S1.
Marrow: The visualized bone marrow is normal.
T10-T11: Minimal disc bulge. No canal or foraminal stenosis.
T11-12: No disc protrusion. No canal or foraminal stenosis.
T12-L1: No disc protrusion. No canal or foraminal stenosis.
L1-L2: No disc protrusion, canal stenosis, or foraminal stenosis.
L2-L3: No disc protrusion, canal stenosis, or foraminal stenosis.
L3-L4: No disc protrusion, canal stenosis, or foraminal stenosis.
L4-L5: Minimal disc bulge. Mild left facet arthrosis. No canal stenosis. No foraminal stenosis.
L5-S1: Rudimentary disc space. No disc protrusion. No canal or foraminal stenosis.
Visualized SI joints: Intact
Visualized soft tissues: Large heterogeneous uterus with questionable mass versus blood clot in the endometrium.
IMPRESSION: 1.  Transitional lumbar anatomy with a sacralized L5.
2.  Minimal disc bulge and mild left facet arthrosis at L4-L5 with no canal or foraminal stenosis.
3.  Mild lumbar levoscoliosis.
4.  Large heterogeneous uterus with questionable mass versus blood clot in the endometrial canal. Recommend MRI pelvis with and without contrast.

## 2023-03-18 IMAGING — MR MRI SHOULDER RT WO CONTRAST
4 series · 16 of 40 positions shown · non-contrast
Comparison: None.

Images Obtained from [facility]
INDICATION: Pain in right shoulder.
TECHNIQUE: Multiplanar, multiecho imaging of the right shoulder was performed, including T1-weighted and fluid sensitive sequences without intravenous contrast.

[Series 5: t2_axial_fs · axial · right · 3.0mm · 0.26mm/px · z∈[+9,+70]mm · 7 of 20 slices shown]
[im 1/20]
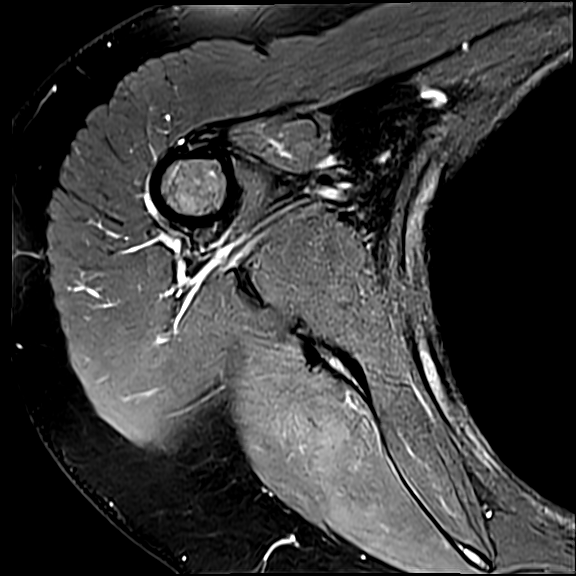
[im 3/20]
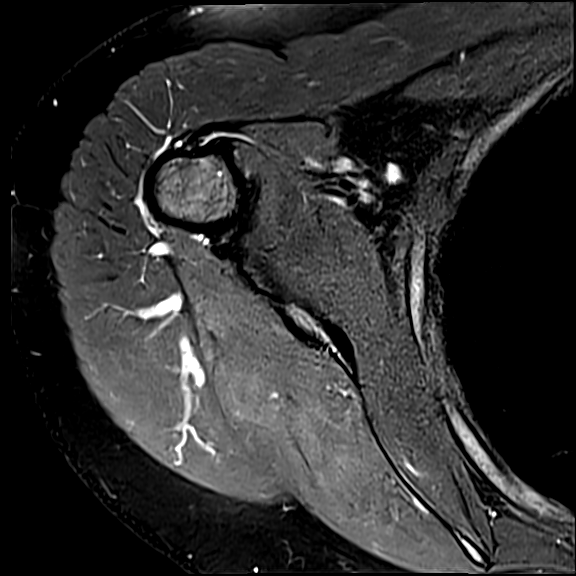
[im 6/20]
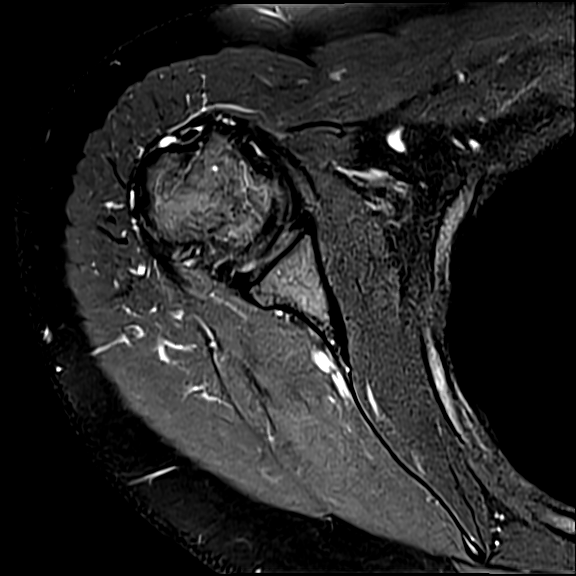
[im 9/20]
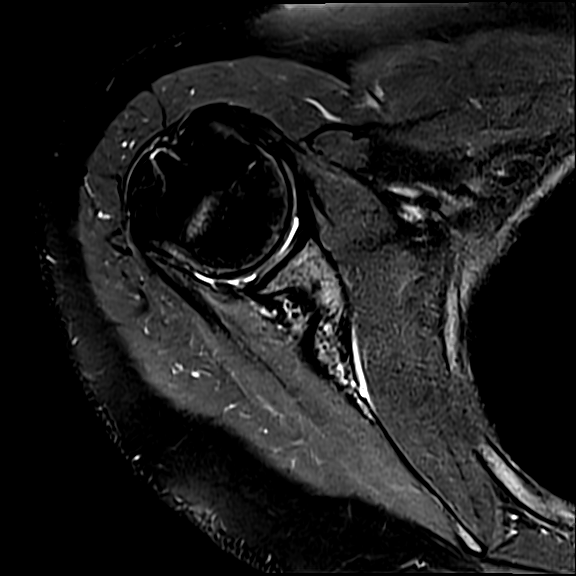
[im 11/20]
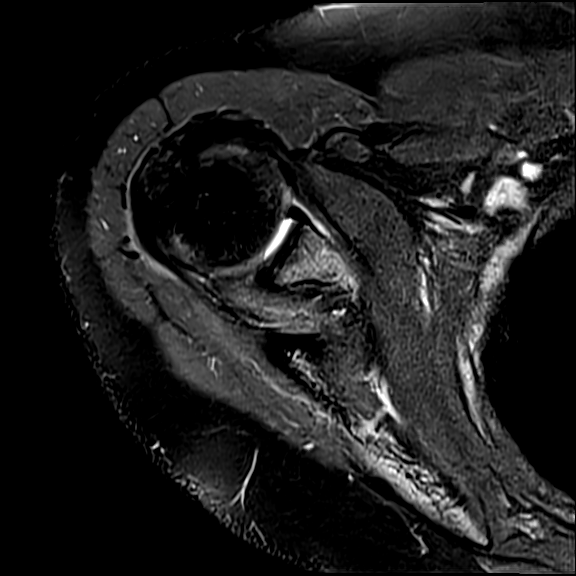
[im 14/20]
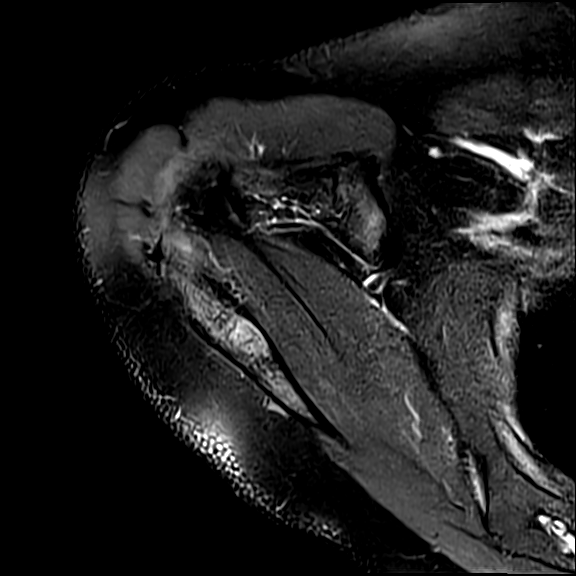
[im 17/20]
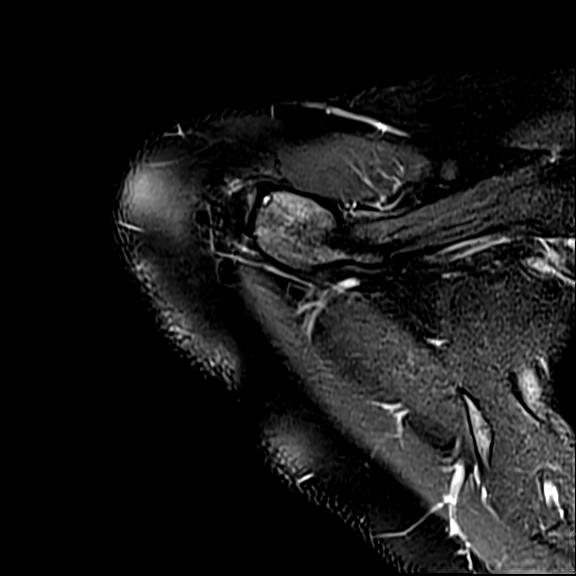

[Series 6: t2_cor_obl_fs · oblique · right · 3.0mm · 0.25mm/px · 3 of 18 slices shown]
[im 3/18]
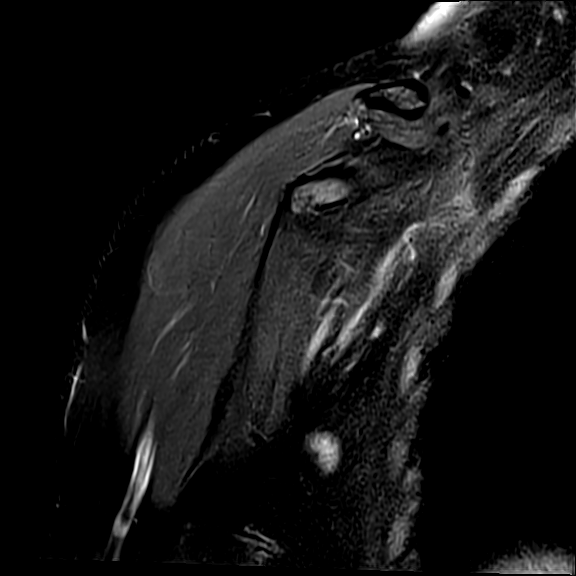
[im 10/18]
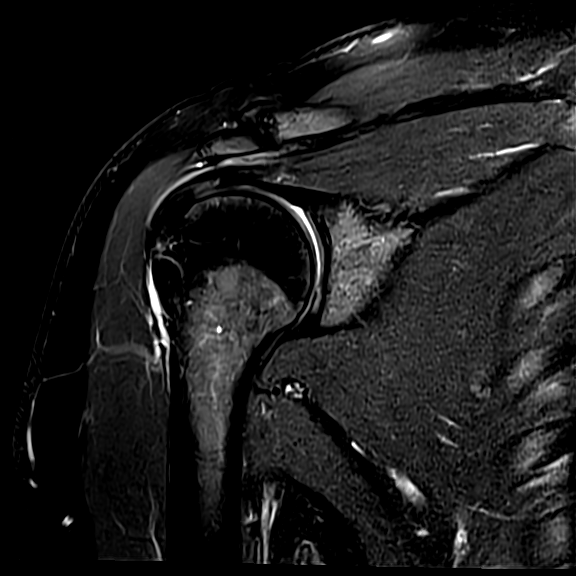
[im 15/18]
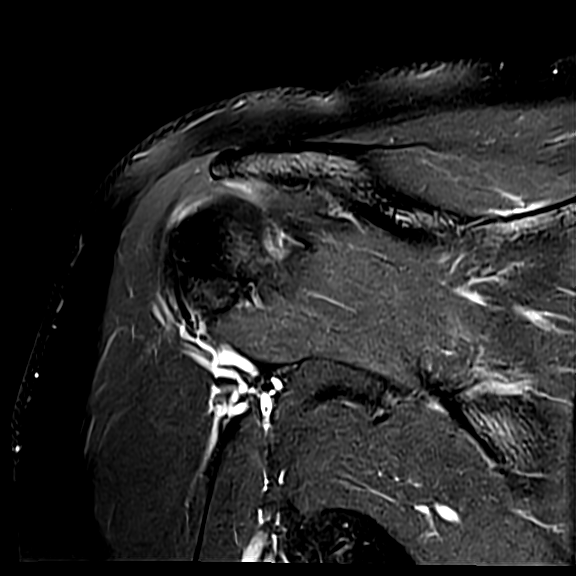

[Series 7: t2_sag_obl_fs · oblique · right · 3.0mm · 0.28mm/px · 3 of 28 slices shown]
[im 5/28]
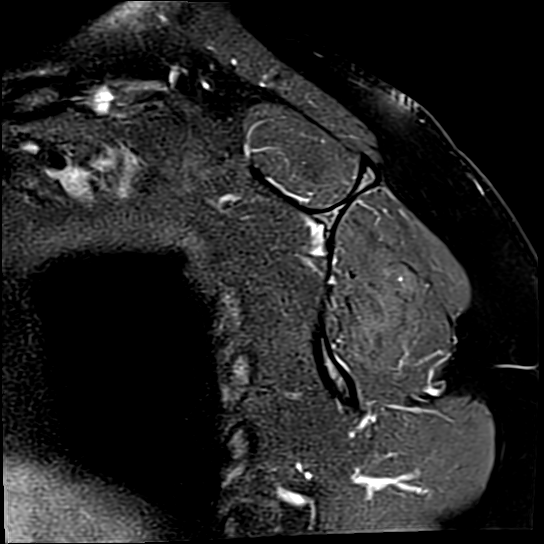
[im 15/28]
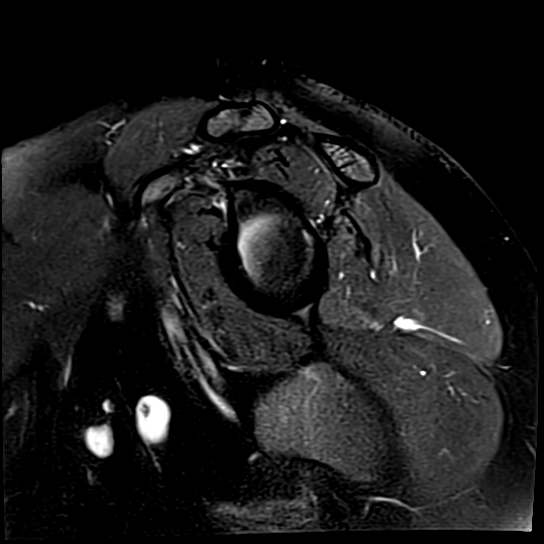
[im 25/28]
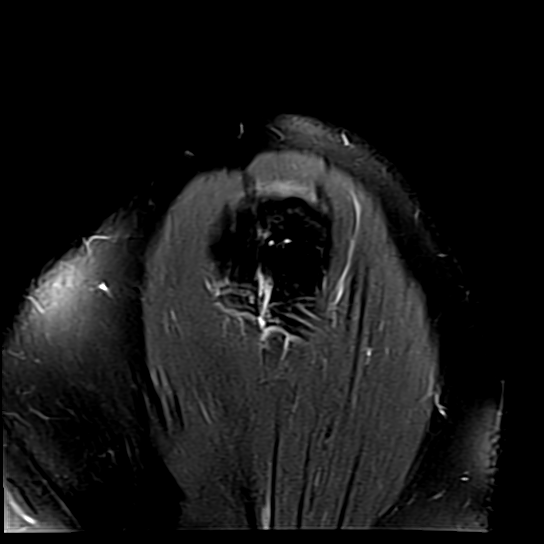

[Series 8: t1_sag_obl · oblique · right · 3.0mm · 0.26mm/px · 3 of 28 slices shown]
[im 5/28]
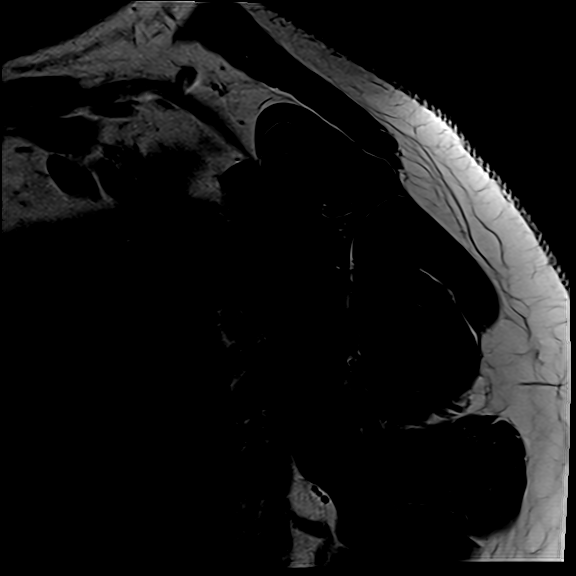
[im 15/28]
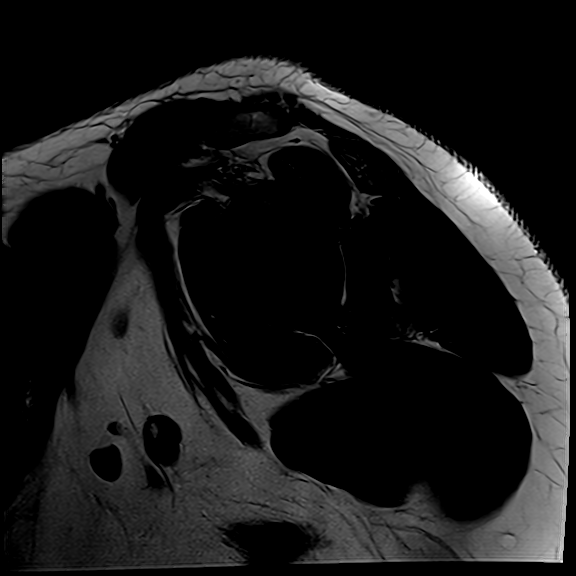
[im 25/28]
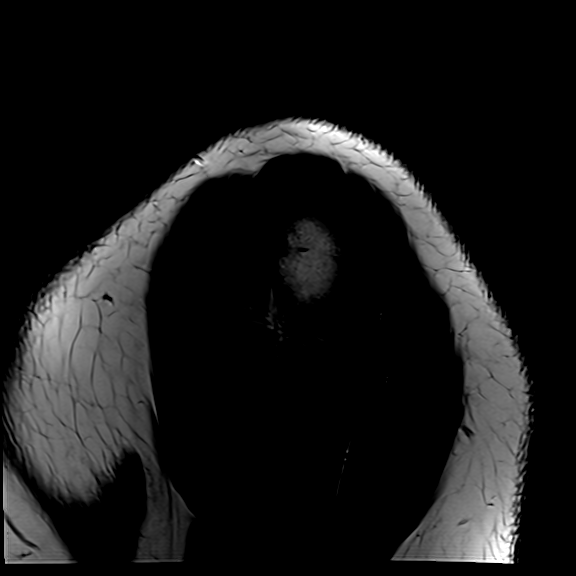

[16 of 40 positions shown; findings below may reference images not displayed]

FINDINGS: Rotator cuff:
Supraspinatus and infraspinatus: Mild insertional tendinosis in the region of the confluence. No visible tear
Subscapularis: Mild insertional tendinosis involving the superiorly positioned fibers
Teres minor: Intact.
Cuff muscles: Normal in size and signal intensity.  No fatty infiltration.
Acromioclavicular joint: Mild DJD. Mild mass effect. Correlate for impingement
ALHASSI bursa: Trace bursitis
Long head biceps tendon: Intact, with normal course.
Rotator Interval: No scar or obliteration of fat.
Labrum: Intact.
Cartilage: Intact.
Marrow: No acute fracture or destructive process
No joint effusion.
No visible cystic or solid soft tissue masses
Scattered prominent lymph nodes within the axilla. Largest measuring up to 15 mm in size. Correlate for right upper extremity infection. Correlate with mammography.
IMPRESSION: 1.  Mild insertional tendinosis of the supraspinatus, infraspinatus, and subscapularis tendons. No visible tear.
2.  Mild AC joint DJD with mild mass effect on the supraspinatus. Correlate for impingement.
3.  Scattered prominent lymph nodes within the right axilla, largest measuring up to 15 mm in size. Correlate for right upper extremity infection. Correlate with mammography.

## 2023-04-18 IMAGING — MG MAMMO DIAG BIL W/CAD TOMO
8 series · 8 of 24 positions shown · non-contrast
Comparison: Multiple prior exams most recently 04/27/2022

Images Obtained from [facility]
INDICATION: Follow-up for right axillary lump.
TECHNIQUE: Bilateral 2-D digital diagnostic mammogram was performed followed by 3-D tomosynthesis. Current study was also evaluated with a computer aided detection (CAD) system. Targeted right
breast ultrasound was also performed.

[R CC]
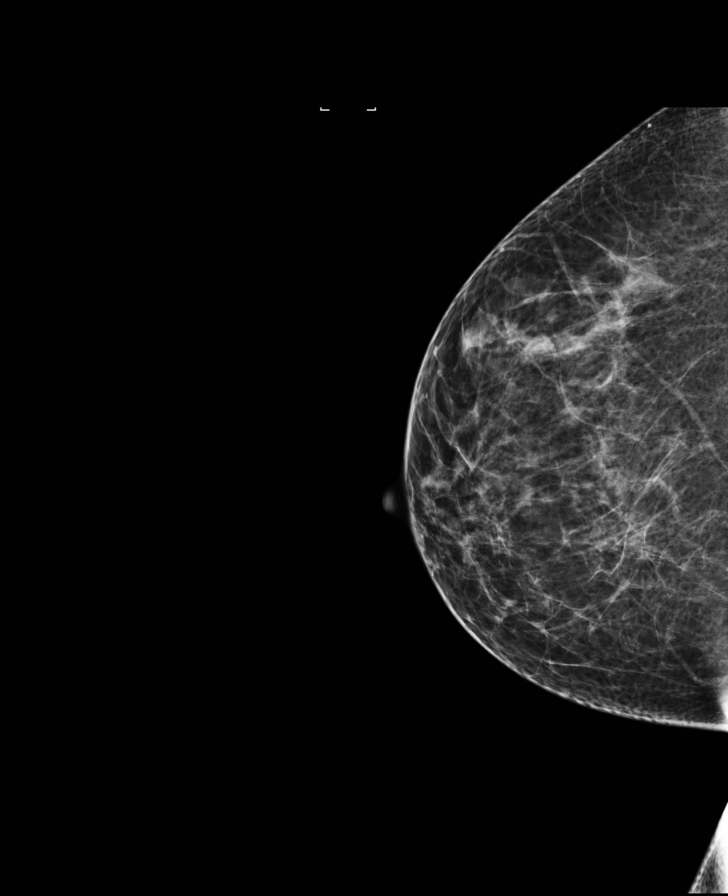

[L MLO]
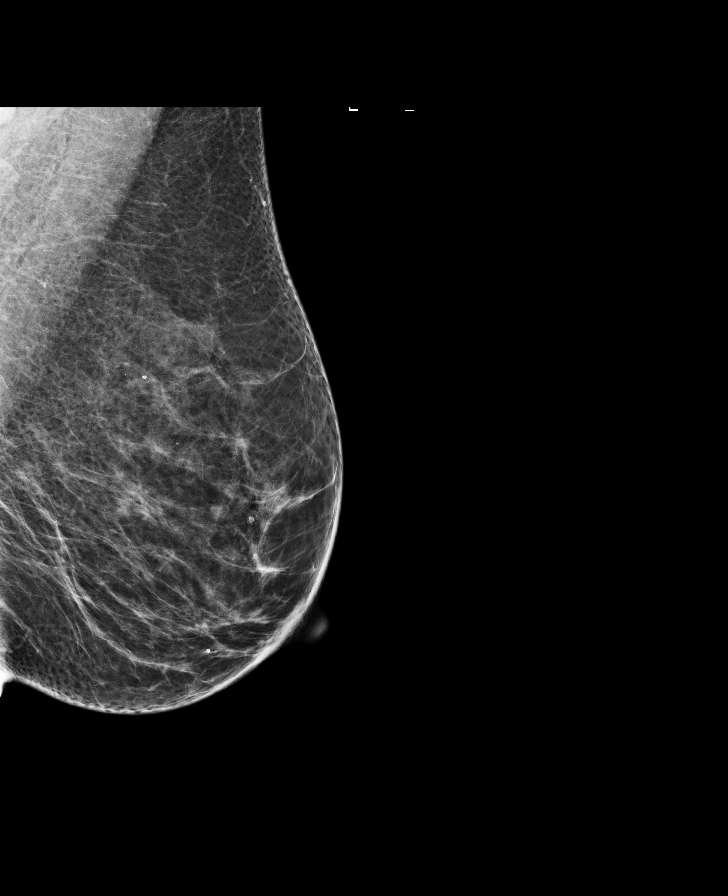

[R MLO]
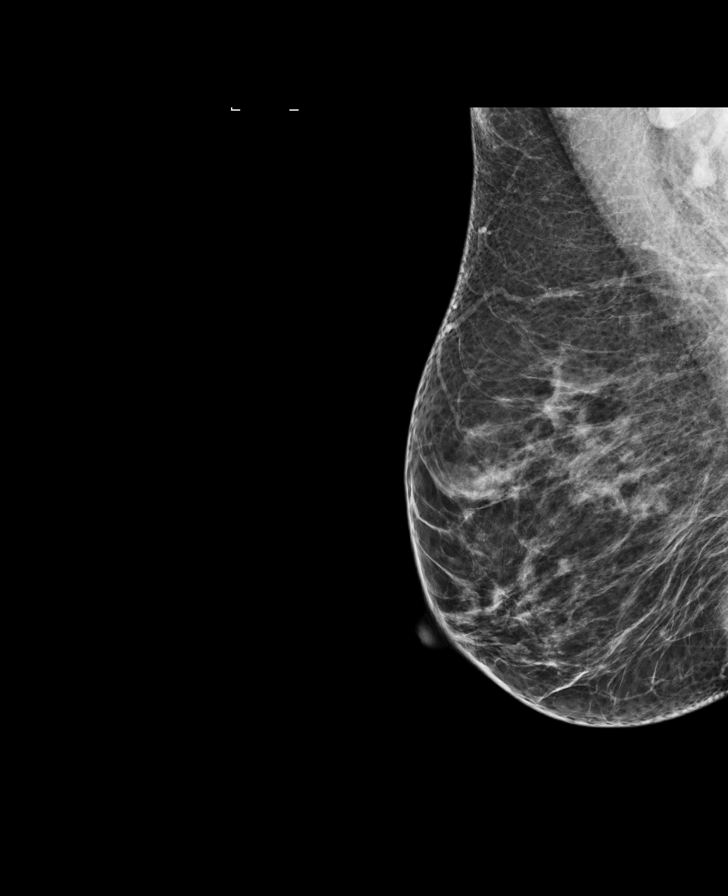

[L CC]
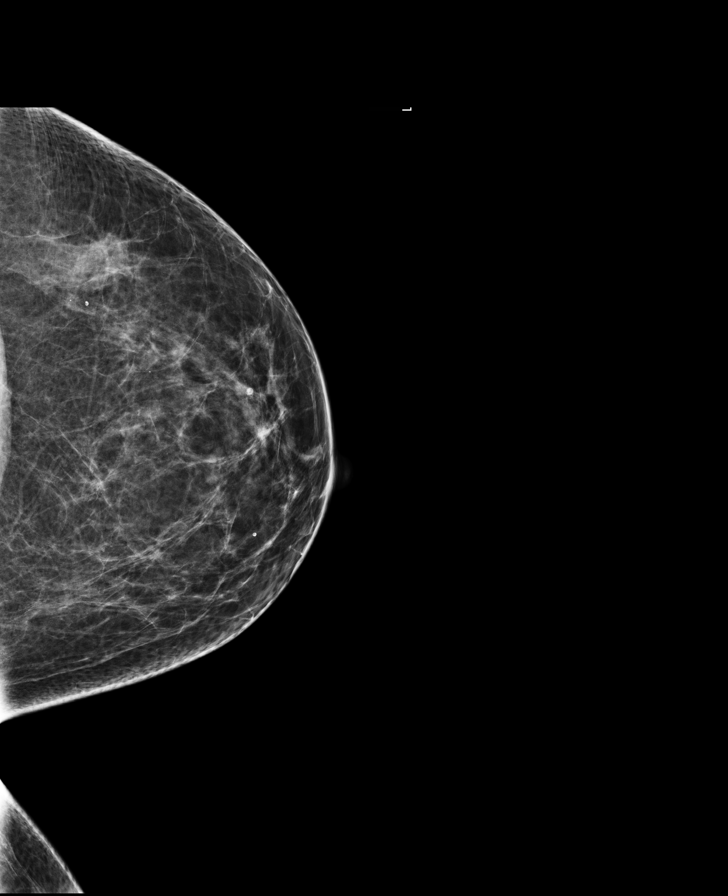

[L CC tomo · tomo slice 37/73.0]
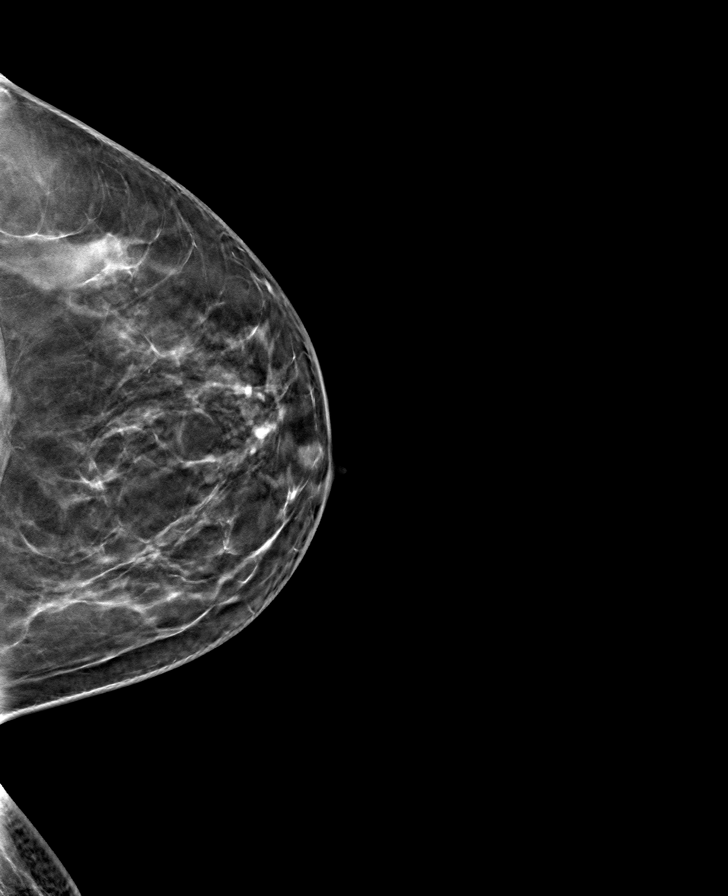

[R CC tomo · tomo slice 35/69.0]
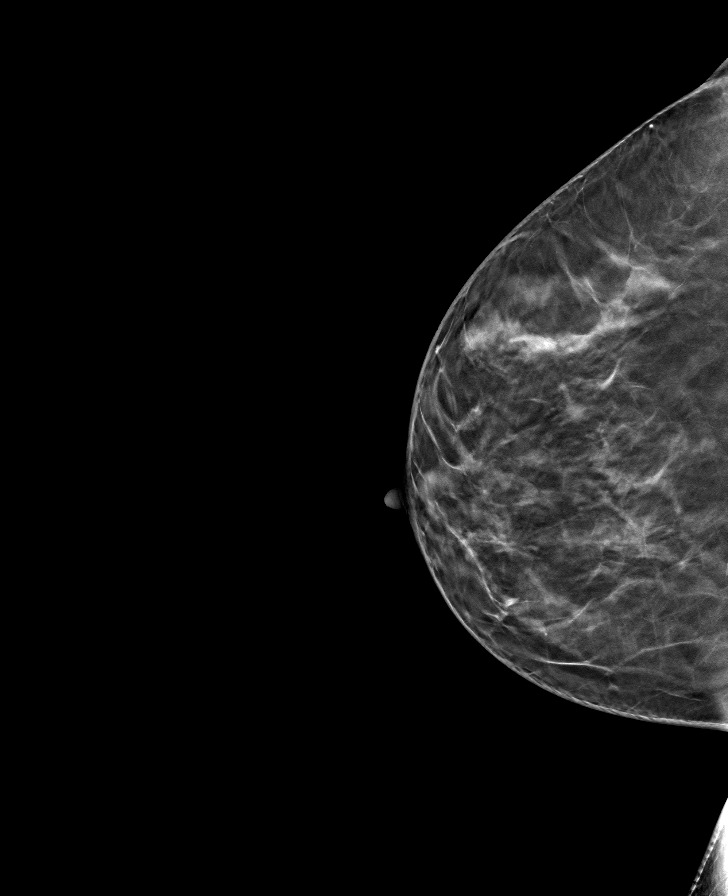

[R MLO tomo · tomo slice 39/77.0]
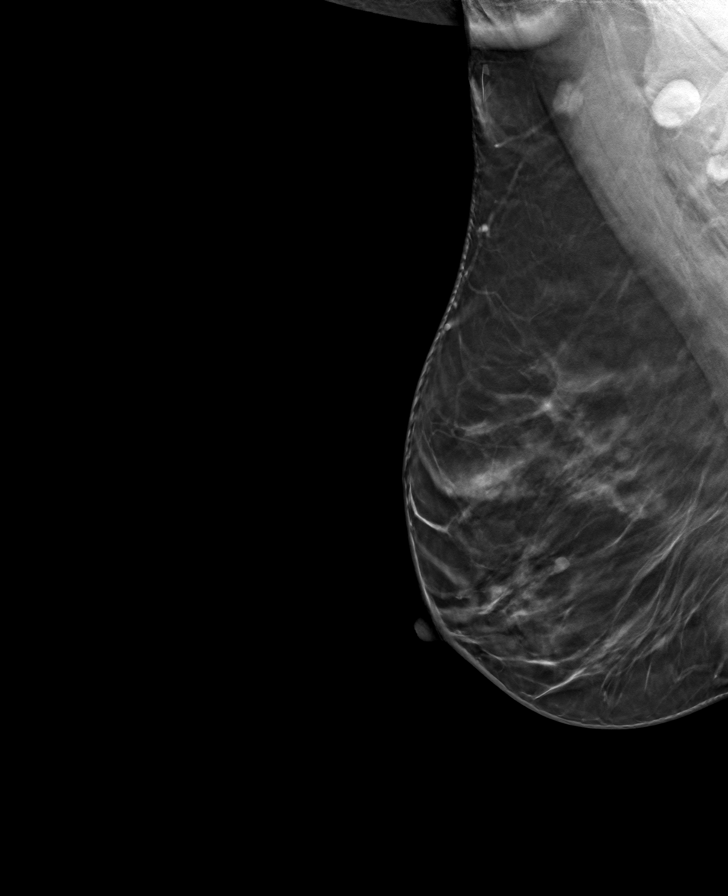

[L MLO tomo · tomo slice 39/77.0]
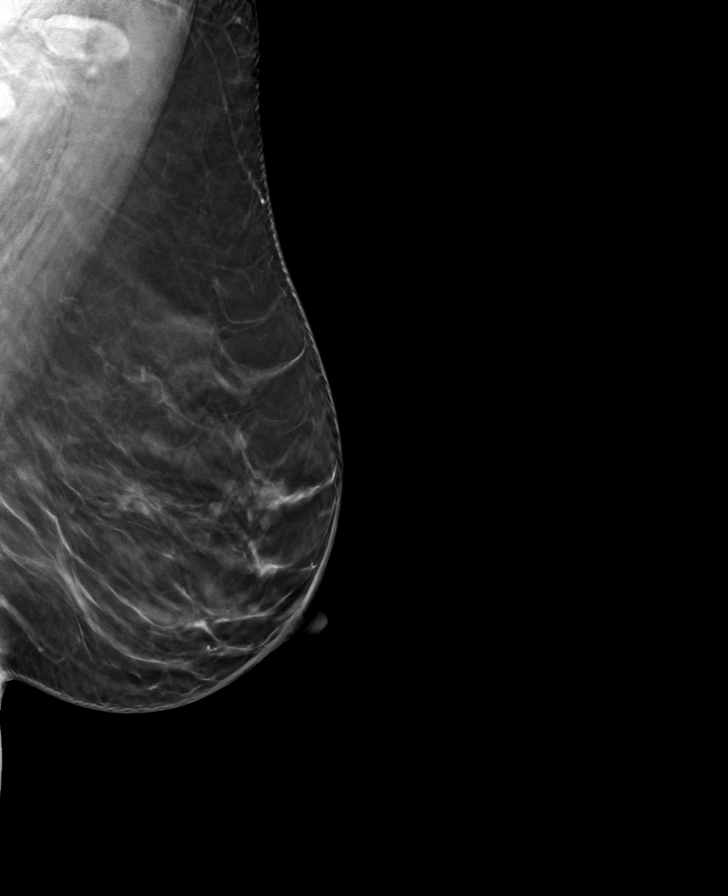

[8 of 24 positions shown; findings below may reference images not displayed]

FINDINGS: BILATERAL DIAGNOSTIC MAMMOGRAM:  Category B: There are scattered areas of fibroglandular density. No significant interval change. No suspicious findings.
TARGETED RIGHT BREAST ULTRASOUND: No suspicious finding is seen in the right axilla. Right axillary lymph nodes are normal in size and morphology with preserved fatty hilum. No suspicious right
axillary lymph nodes.
IMPRESSION: There is no mammographic or targeted sonographic evidence of malignancy. Follow-up with annual screening mammogram at age 39.
The patient received a copy of the results at the end of the examination.
FINAL ASSESSMENT: BI-RADS: Category 1 Negative

## 2023-04-18 IMAGING — US US BREAST RT LTD
1 series · 9 of 9 positions shown · non-contrast
Comparison: Multiple prior exams most recently 04/27/2022

Images Obtained from [facility]
INDICATION: Follow-up for right axillary lump.
TECHNIQUE: Bilateral 2-D digital diagnostic mammogram was performed followed by 3-D tomosynthesis. Current study was also evaluated with a computer aided detection (CAD) system. Targeted right
breast ultrasound was also performed.

[Series 1: us breast right ltd · 9 of 9 slices shown]
[im 1/9]
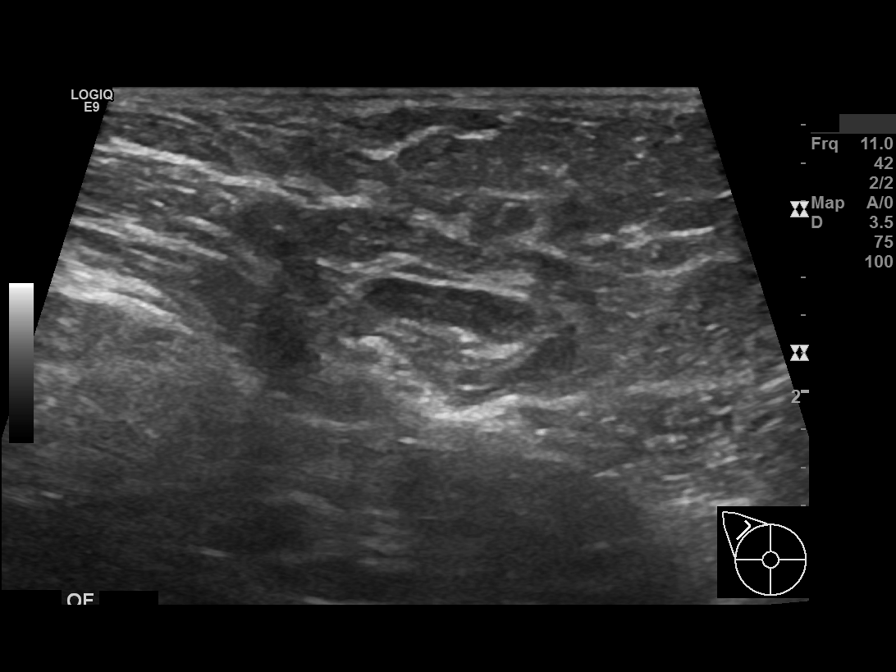
[im 2/9]
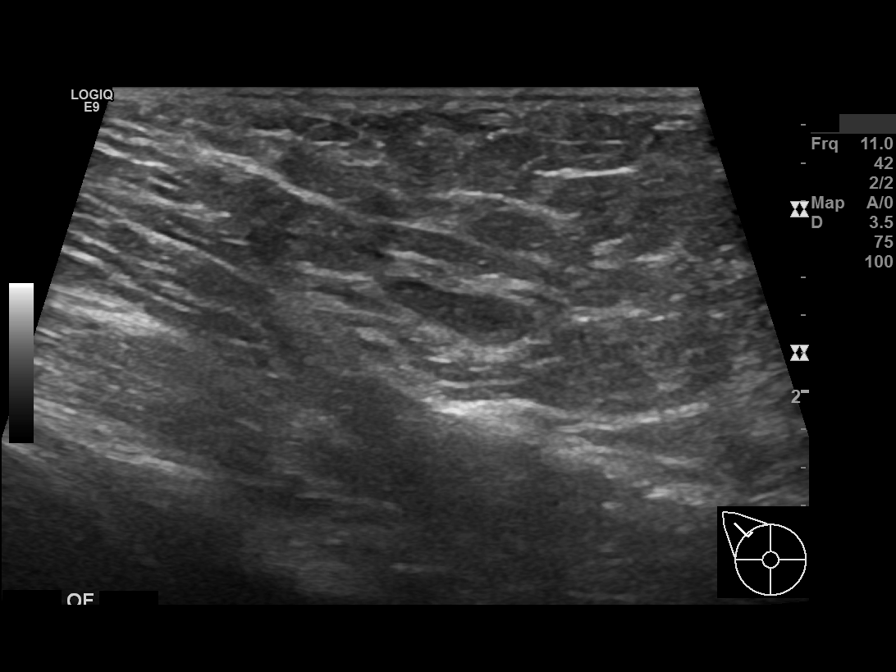
[im 3/9]
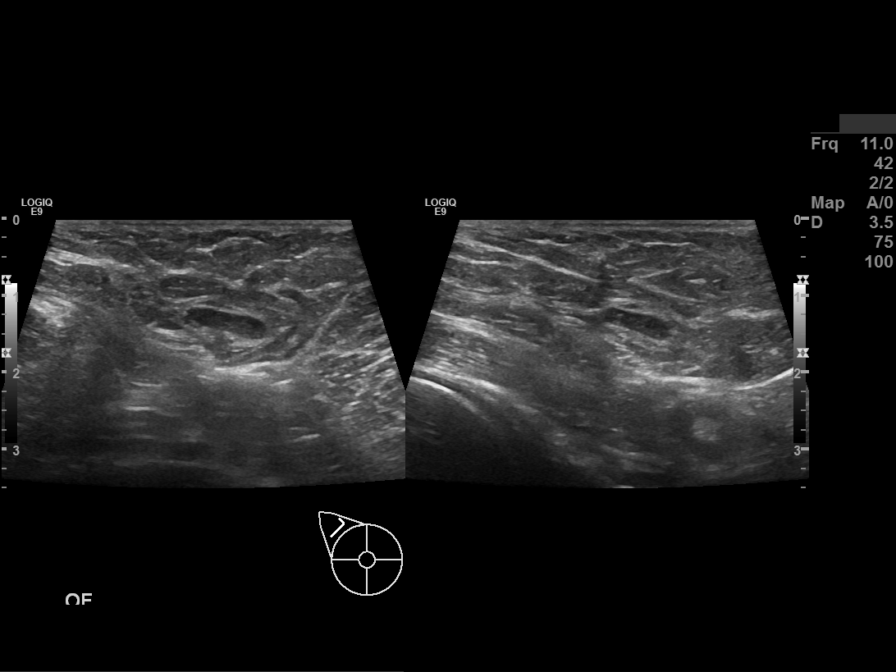
[im 4/9]
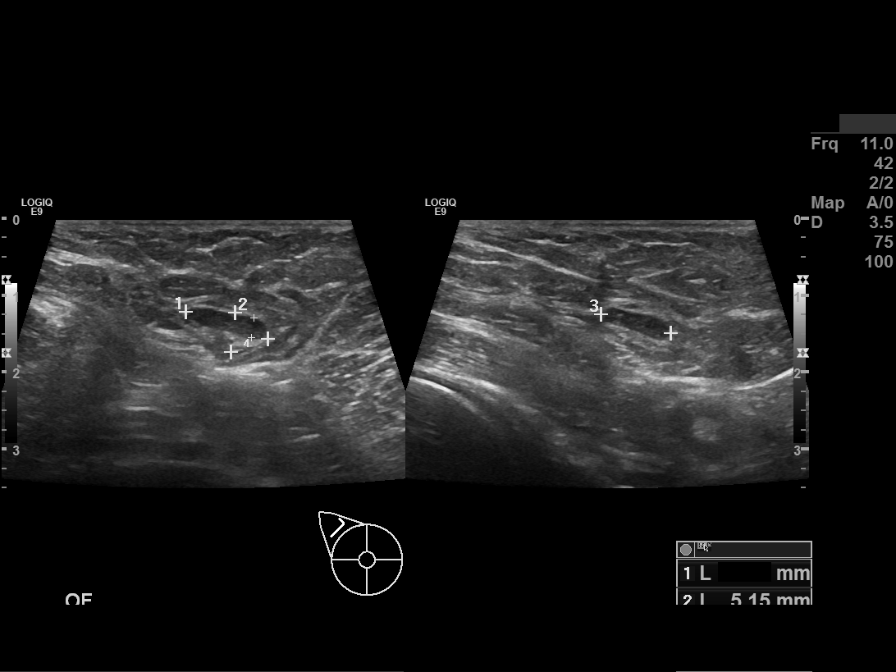
[im 5/9]
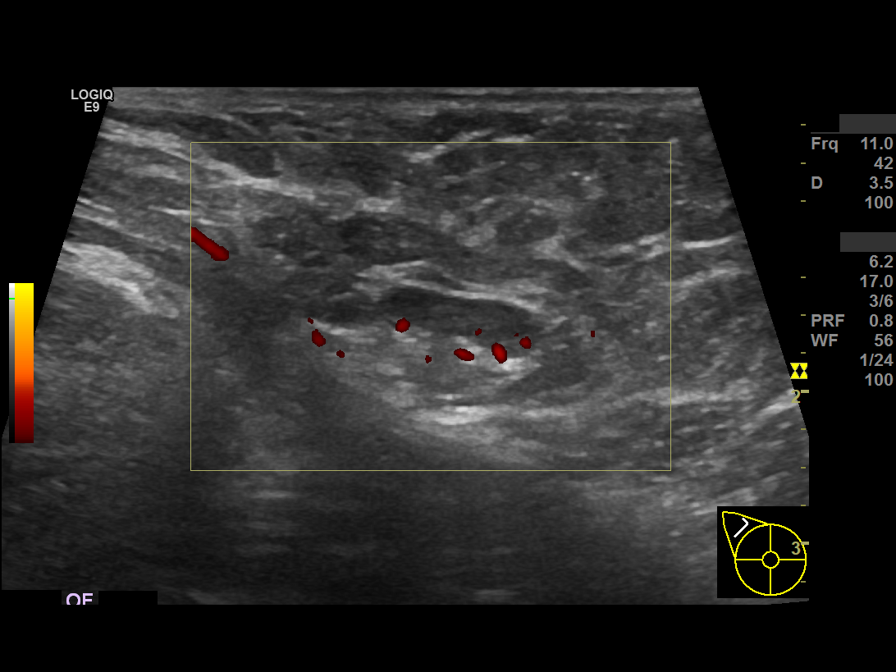
[im 6/9]
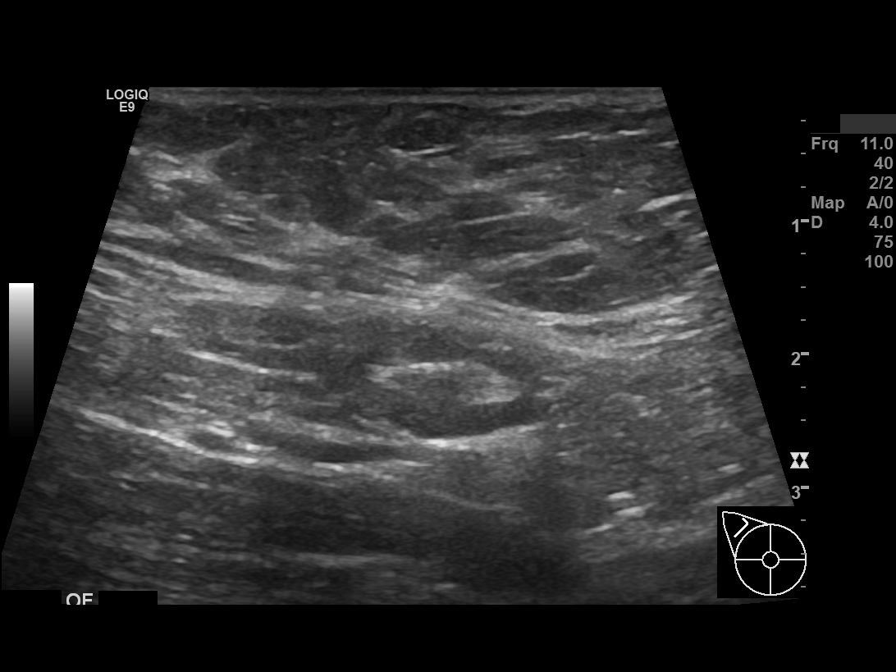
[im 7/9]
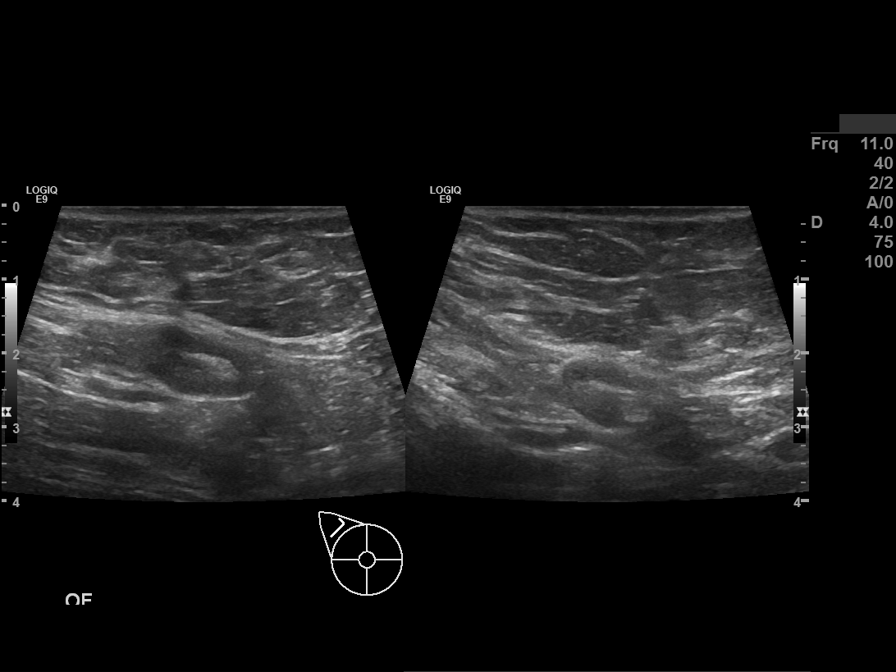
[im 8/9]
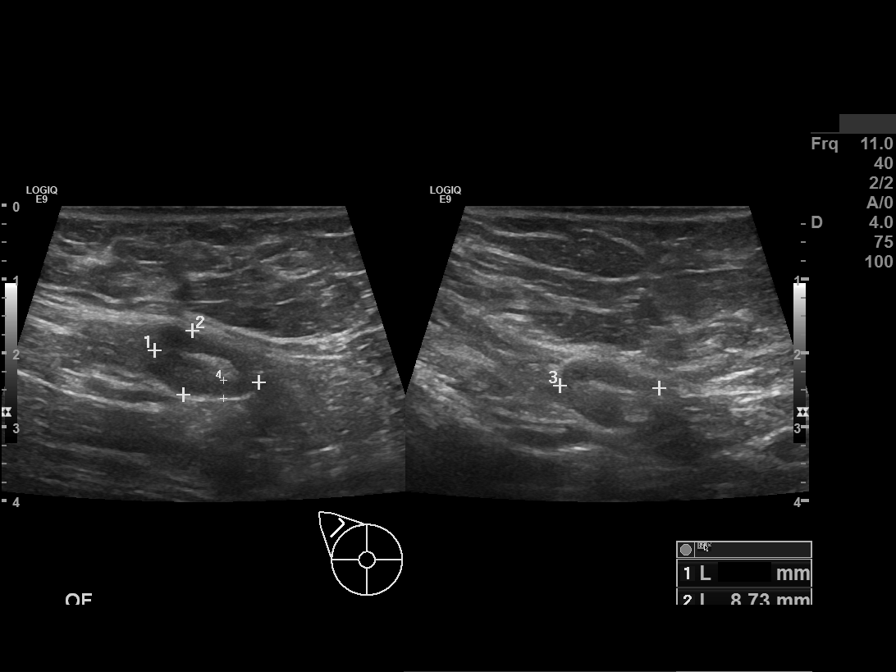
[im 9/9]
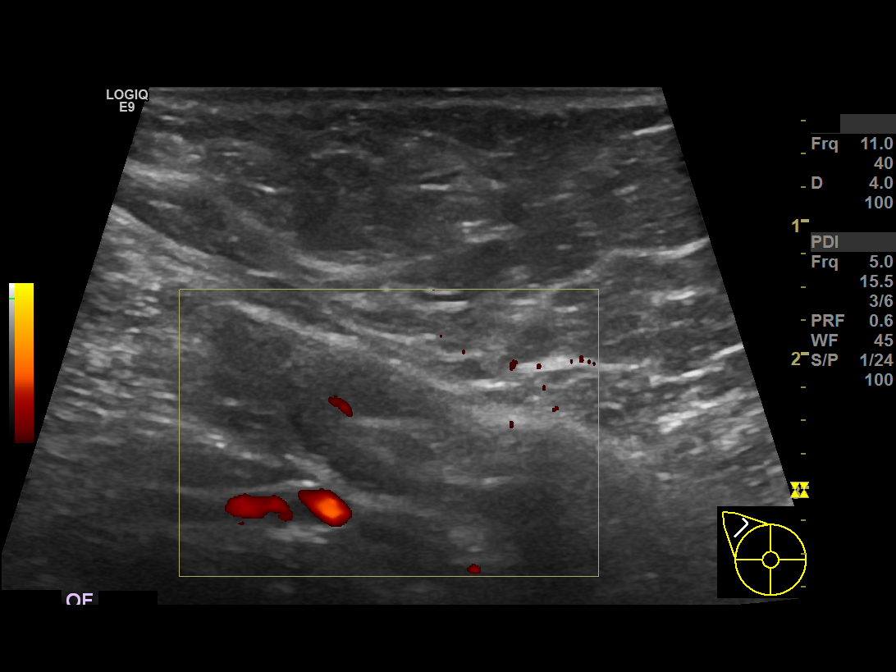

[9 of 9 positions shown; findings below may reference images not displayed]

FINDINGS: BILATERAL DIAGNOSTIC MAMMOGRAM:  Category B: There are scattered areas of fibroglandular density. No significant interval change. No suspicious findings.
TARGETED RIGHT BREAST ULTRASOUND: No suspicious finding is seen in the right axilla. Right axillary lymph nodes are normal in size and morphology with preserved fatty hilum. No suspicious right
axillary lymph nodes.
IMPRESSION: There is no mammographic or targeted sonographic evidence of malignancy. Follow-up with annual screening mammogram at age 39.
The patient received a copy of the results at the end of the examination.
FINAL ASSESSMENT: BI-RADS: Category 1 Negative
